# Patient Record
Sex: Female | Born: 1970 | Hispanic: Yes | Marital: Single | Smoking: Never smoker
Health system: Southern US, Community
[De-identification: ages and names within clinical notes are randomized; demographics above are authoritative.]

## PROBLEM LIST (undated history)

## (undated) DIAGNOSIS — E119 Type 2 diabetes mellitus without complications: Secondary | ICD-10-CM

---

## 2005-03-21 ENCOUNTER — Emergency Department: Payer: Self-pay | Admitting: Emergency Medicine

## 2006-04-15 ENCOUNTER — Other Ambulatory Visit: Payer: Self-pay

## 2006-04-15 ENCOUNTER — Emergency Department: Payer: Self-pay | Admitting: Emergency Medicine

## 2006-05-05 ENCOUNTER — Ambulatory Visit: Payer: Self-pay | Admitting: Family Medicine

## 2007-06-15 ENCOUNTER — Ambulatory Visit: Payer: Self-pay | Admitting: Obstetrics and Gynecology

## 2007-06-16 ENCOUNTER — Observation Stay: Payer: Self-pay | Admitting: Obstetrics and Gynecology

## 2007-06-27 ENCOUNTER — Inpatient Hospital Stay: Payer: Self-pay

## 2007-06-27 ENCOUNTER — Observation Stay: Payer: Self-pay

## 2008-06-17 ENCOUNTER — Observation Stay: Payer: Self-pay | Admitting: Emergency Medicine

## 2008-07-01 ENCOUNTER — Encounter: Payer: Self-pay | Admitting: Obstetrics & Gynecology

## 2008-11-22 ENCOUNTER — Ambulatory Visit: Payer: Self-pay | Admitting: Obstetrics and Gynecology

## 2008-11-25 ENCOUNTER — Inpatient Hospital Stay: Payer: Self-pay | Admitting: Obstetrics and Gynecology

## 2010-04-08 IMAGING — US US OB DETAIL+14 WK - NRPT [facility]
1 series · 14 of 28 positions shown · non-contrast
Comparison: none

[Series 1: us ob detail+14 wk - nrpt [facility] · 14 of 73 slices shown]
[im 3/73]
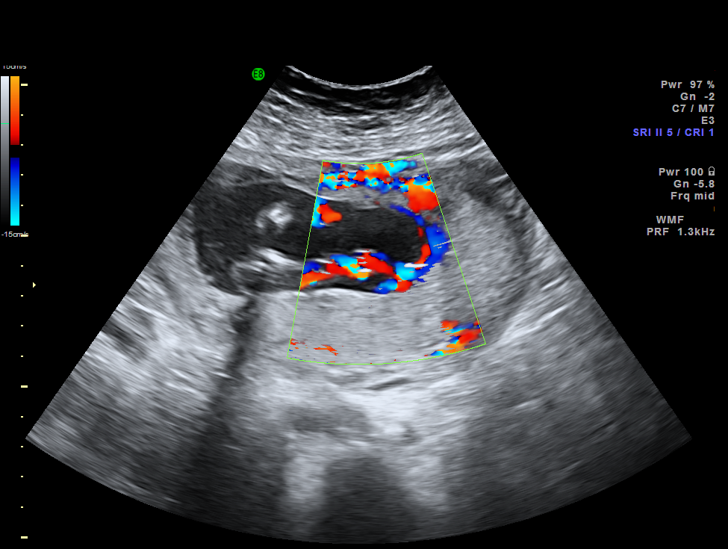
[im 9/73]
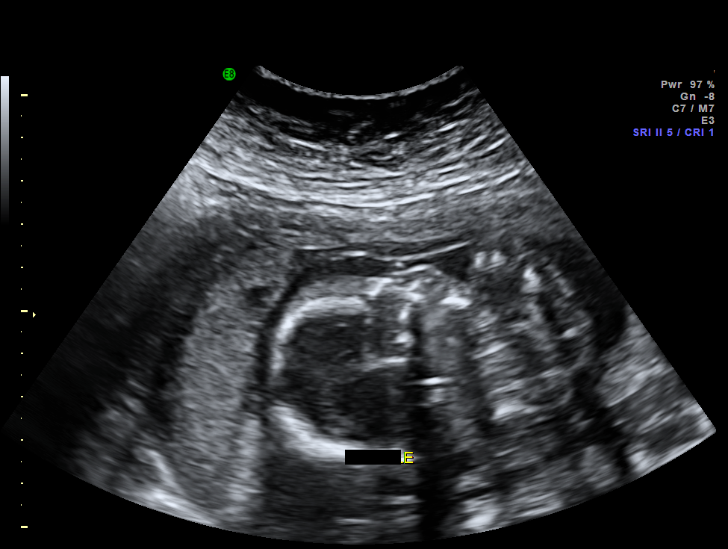
[im 14/73]
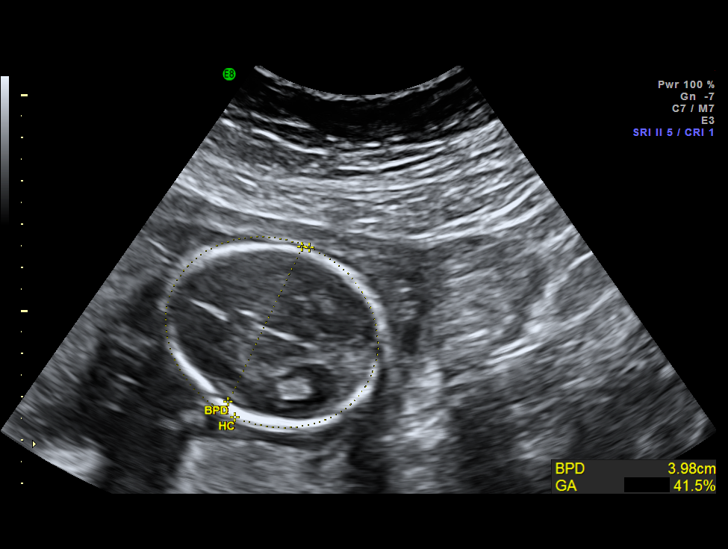
[im 19/73]
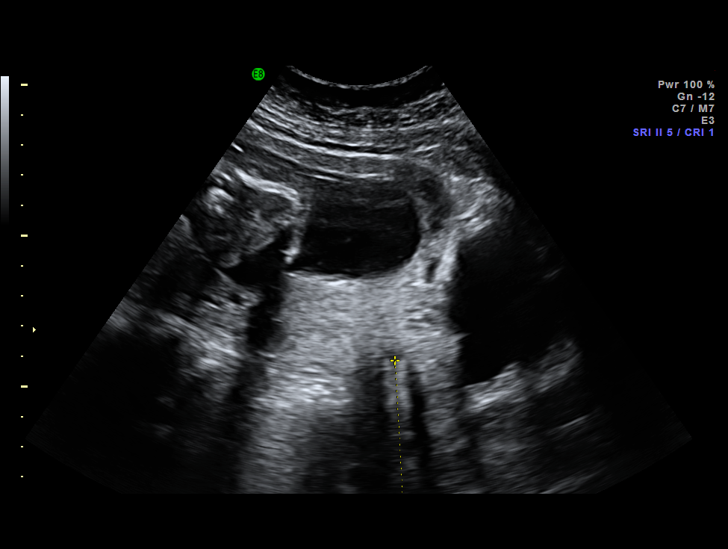
[im 25/73]
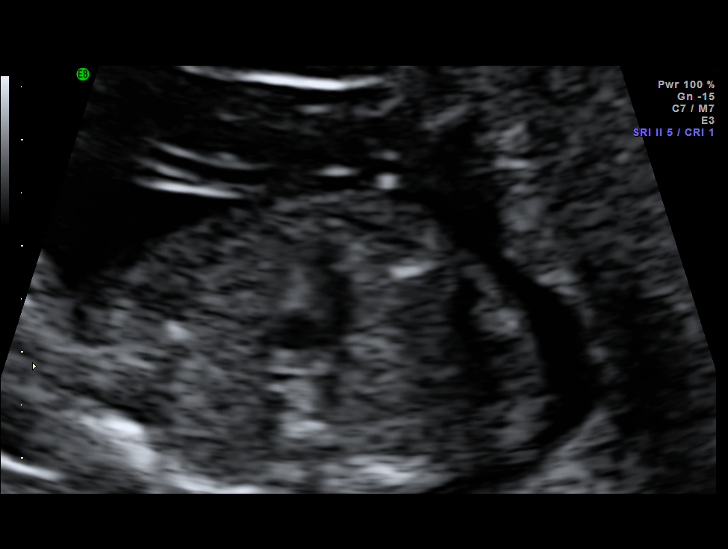
[im 30/73]
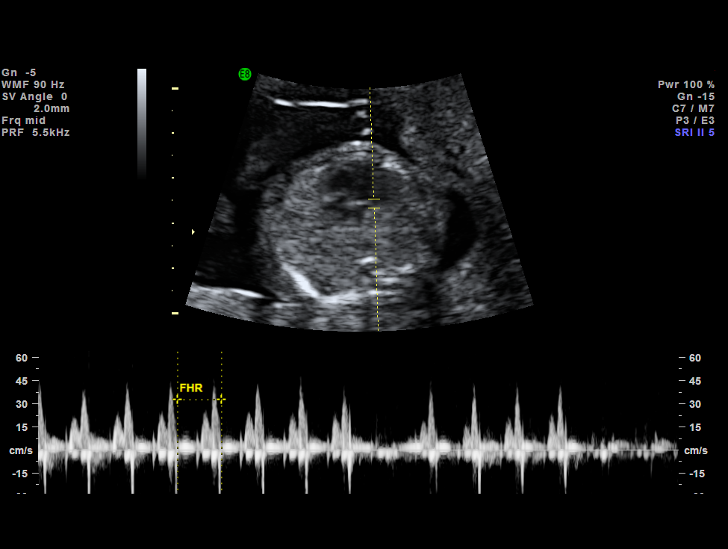
[im 35/73]
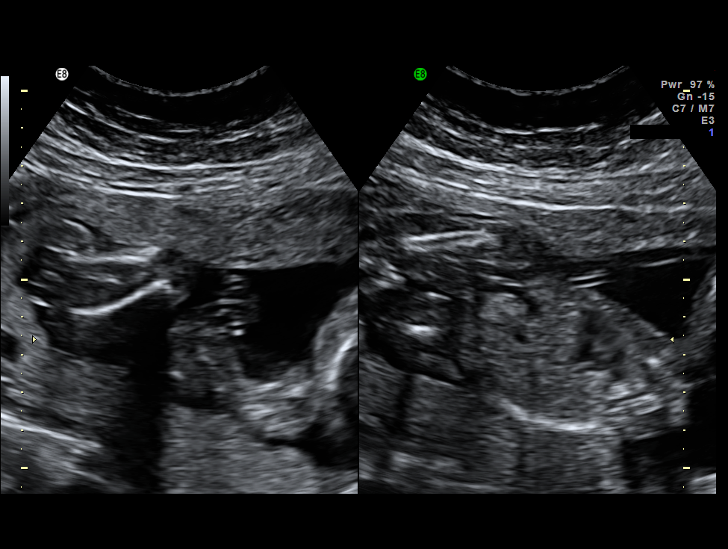
[im 41/73]
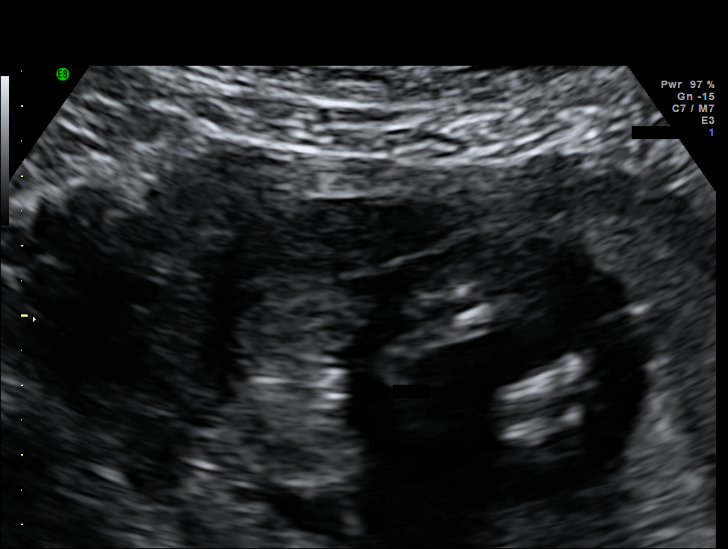
[im 46/73]
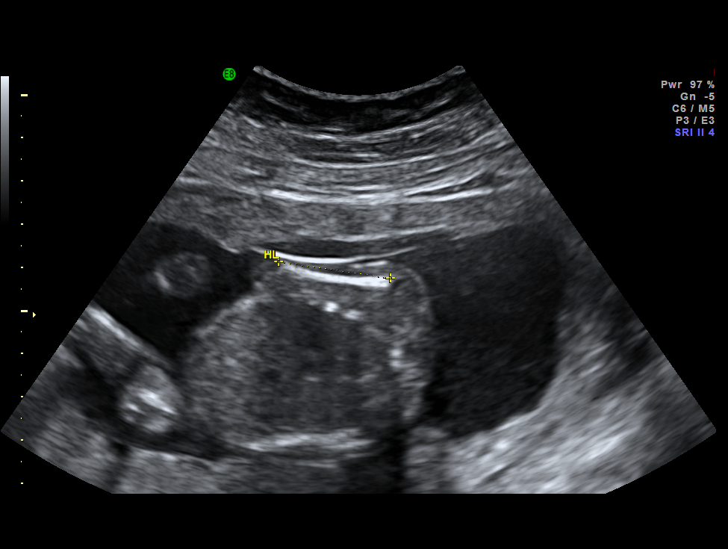
[im 51/73]
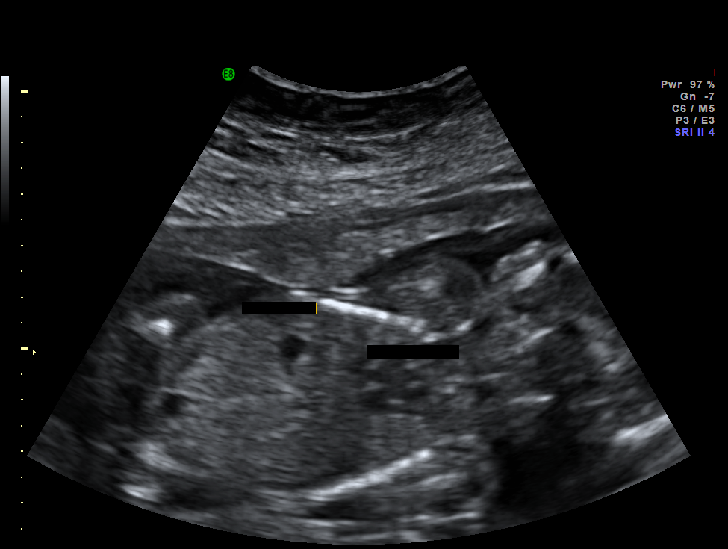
[im 57/73]
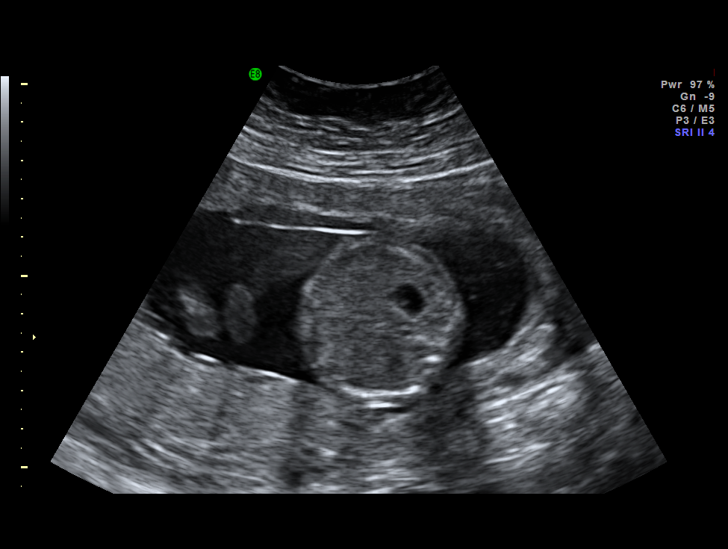
[im 62/73]
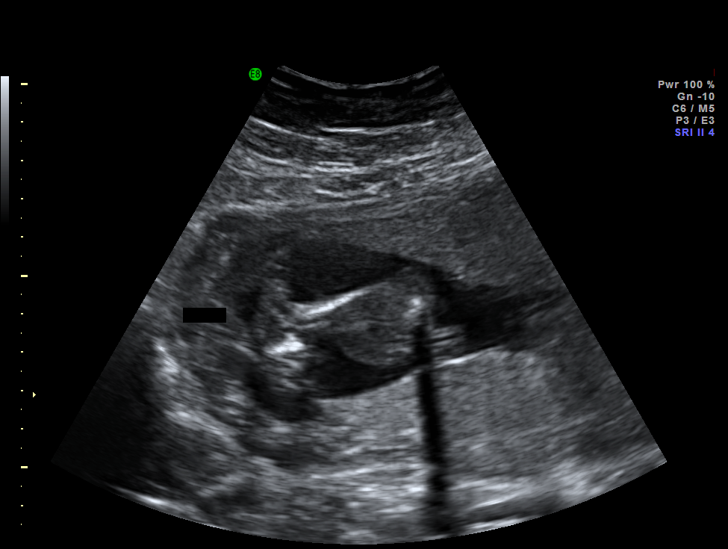
[im 67/73]
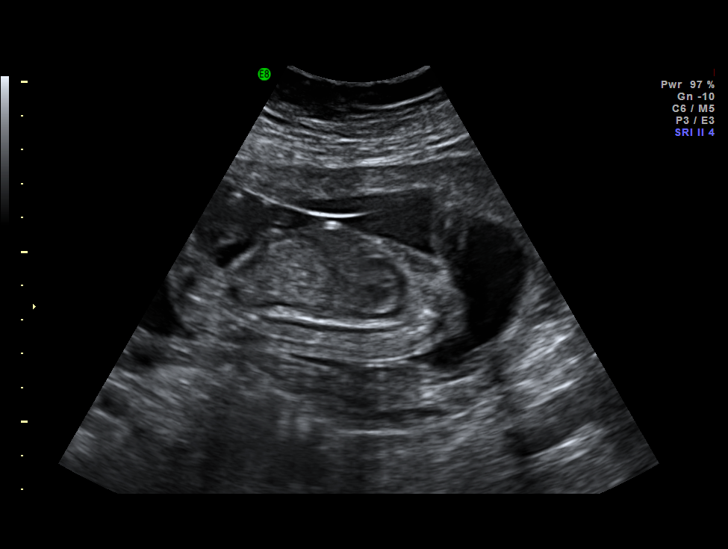
[im 73/73]
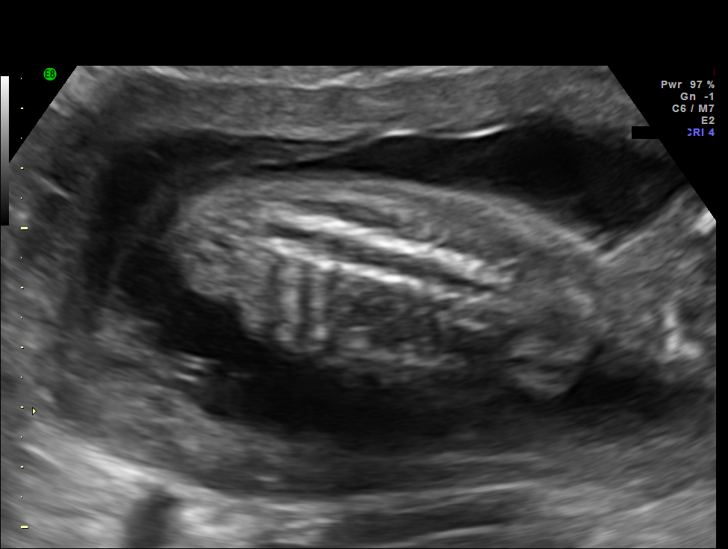

[14 of 28 positions shown; findings below may reference images not displayed]

IMAGES IMPORTED FROM THE SYNGO WORKFLOW SYSTEM
NO DICTATION FOR STUDY

## 2017-02-09 ENCOUNTER — Ambulatory Visit: Payer: Self-pay

## 2017-03-02 ENCOUNTER — Ambulatory Visit: Payer: Self-pay | Admitting: Internal Medicine

## 2017-03-02 ENCOUNTER — Encounter: Payer: Self-pay | Admitting: Internal Medicine

## 2017-03-02 VITALS — BP 119/80 | HR 72 | Temp 98.1°F | Wt 218.7 lb

## 2017-03-02 DIAGNOSIS — Z0000 Encounter for general adult medical examination without abnormal findings: Secondary | ICD-10-CM

## 2017-03-02 NOTE — Progress Notes (Signed)
   Subjective:    Patient ID: Cassandra Conley, female    DOB: 02/23/1970, 47 y.o.   MRN: [id number]  HPI  Pt has been experiencing vaginal itching for about 2 months. There is no discharge.  She has a burning sensation in her eyes and it is "grainy".   She also has pain in her left leg and her ankle is sometimes swollen.   Allergies as of 03/02/2017   No Known Allergies     Medication List    as of 03/02/2017 10:27 AM   You have not been prescribed any medications.    There are no active problems to display for this patient.   Review of Systems     Objective:   Physical Exam  Constitutional: She is oriented to person, place, and time.  Cardiovascular: Normal rate, regular rhythm and normal heart sounds.  Pulmonary/Chest: Effort normal and breath sounds normal.  Neurological: She is alert and oriented to person, place, and time.     BP 119/80   Pulse 72   Temp 98.1 F (36.7 C) (Oral)   Wt 218 lb 11.2 oz (99.2 kg)   Examination of left leg does not show significant edema. Pulses are shallow but present.     Assessment & Plan:    Referral for eye exam at the clinic. [facility] [place]   Labs ordered today: CMET, CBC, UA, TSH, lipid panel  No f/u appointment needed unless abnormal lab results.

## 2017-03-03 LAB — COMPREHENSIVE METABOLIC PANEL
A/G RATIO: 1.1 — AB (ref 1.2–2.2)
ALT: 17 IU/L (ref 0–32)
AST: 16 IU/L (ref 0–40)
Albumin: 4 g/dL (ref 3.5–5.5)
Alkaline Phosphatase: 108 IU/L (ref 39–117)
BUN / CREAT RATIO: 14 (ref 9–23)
BUN: 12 mg/dL (ref 6–24)
CHLORIDE: 103 mmol/L (ref 96–106)
CO2: 19 mmol/L — ABNORMAL LOW (ref 20–29)
Calcium: 9 mg/dL (ref 8.7–10.2)
Creatinine, Ser: 0.87 mg/dL (ref 0.57–1.00)
GFR calc non Af Amer: 80 mL/min/{1.73_m2} (ref >59)
GFR, EST AFRICAN AMERICAN: 92 mL/min/{1.73_m2} (ref >59)
GLOBULIN, TOTAL: 3.6 g/dL (ref 1.5–4.5)
Glucose: 194 mg/dL — ABNORMAL HIGH (ref 65–99)
POTASSIUM: 4.7 mmol/L (ref 3.5–5.2)
SODIUM: 137 mmol/L (ref 134–144)
TOTAL PROTEIN: 7.6 g/dL (ref 6.0–8.5)

## 2017-03-03 LAB — URINALYSIS
BILIRUBIN UA: NEGATIVE
KETONES UA: NEGATIVE
LEUKOCYTES UA: NEGATIVE
Nitrite, UA: NEGATIVE
PROTEIN UA: NEGATIVE
RBC UA: NEGATIVE
SPEC GRAV UA: 1.023 (ref 1.005–1.030)
Urobilinogen, Ur: 0.2 mg/dL (ref 0.2–1.0)
pH, UA: 5.5 (ref 5.0–7.5)

## 2017-03-03 LAB — LIPID PANEL
CHOL/HDL RATIO: 4 ratio (ref 0.0–4.4)
Cholesterol, Total: 169 mg/dL (ref 100–199)
HDL: 42 mg/dL (ref >39)
LDL Calculated: 90 mg/dL (ref 0–99)
Triglycerides: 183 mg/dL — ABNORMAL HIGH (ref 0–149)
VLDL Cholesterol Cal: 37 mg/dL (ref 5–40)

## 2017-03-03 LAB — CBC
Hematocrit: 36.1 % (ref 34.0–46.6)
Hemoglobin: 11.6 g/dL (ref 11.1–15.9)
MCH: 24.8 pg — AB (ref 26.6–33.0)
MCHC: 32.1 g/dL (ref 31.5–35.7)
MCV: 77 fL — ABNORMAL LOW (ref 79–97)
PLATELETS: 256 10*3/uL (ref 150–379)
RBC: 4.68 x10E6/uL (ref 3.77–5.28)
RDW: 15 % (ref 12.3–15.4)
WBC: 8.5 10*3/uL (ref 3.4–10.8)

## 2017-03-03 LAB — TSH: TSH: 3.14 u[IU]/mL (ref 0.450–4.500)

## 2017-03-21 ENCOUNTER — Ambulatory Visit: Payer: Self-pay | Admitting: Pharmacy Technician

## 2017-03-21 DIAGNOSIS — Z79899 Other long term (current) drug therapy: Secondary | ICD-10-CM

## 2017-03-21 NOTE — Progress Notes (Addendum)
Patient speaks Spanish.  Spanish Interpreter-Otto Afanador provided interpretation for the appointment.  Completed [facility] application and contract.  Patient agreed to all terms of the [facility] contract.    Patient approved to receive medication assistance at [facility] through 2019, as long as eligibility criteria continues to be met.   Provided patient with community resource material based on her particular needs.    [place] [facility]

## 2017-03-30 ENCOUNTER — Ambulatory Visit: Payer: Self-pay | Admitting: Ophthalmology

## 2017-04-13 ENCOUNTER — Ambulatory Visit: Payer: Self-pay | Admitting: Ophthalmology

## 2018-04-12 ENCOUNTER — Telehealth: Payer: Self-pay | Admitting: Pharmacy Technician

## 2018-04-12 NOTE — Telephone Encounter (Signed)
Received 2020 proof of income.  Patient eligible to receive medication assistance at [facility] as long as eligibility requirements continue to be met.  [place] [facility]

## 2019-03-31 ENCOUNTER — Telehealth: Payer: Self-pay | Admitting: Pharmacy Technician

## 2019-03-31 NOTE — Telephone Encounter (Signed)
Received updated proof of income.  Patient eligible to receive medication assistance at [facility] until time for re-certification in 9359, and as long as eligibility requirements continue to be met.  [place] [facility]

## 2019-04-01 ENCOUNTER — Ambulatory Visit: Payer: Self-pay | Attending: Internal Medicine

## 2019-04-01 DIAGNOSIS — Z23 Encounter for immunization: Secondary | ICD-10-CM

## 2019-04-01 NOTE — Progress Notes (Signed)
   Covid-19 Vaccination Clinic  Name:  Cassandra Conley    MRN: [id number] DOB: 1970/03/16  04/01/2019  Ms. Cassandra Conley was observed post Covid-19 immunization for 15 minutes without incident. She was provided with Vaccine Information Sheet and instruction to access the V-Safe system.   Ms. Cassandra Conley was instructed to call 911 with any severe reactions post vaccine: Marland Kitchen Difficulty breathing  . Swelling of face and throat  . A fast heartbeat  . A bad rash all over body  . Dizziness and weakness   Immunizations Administered    Name Date Dose VIS Date Route   Pfizer COVID-19 Vaccine 04/01/2019  2:38 PM 0.3 mL 12/22/2018 Intramuscular   Manufacturer: [employer], [employer]   Lot: [id number]   NDC: [address]-8

## 2019-04-22 ENCOUNTER — Ambulatory Visit: Payer: Self-pay | Attending: Internal Medicine

## 2019-04-22 DIAGNOSIS — Z23 Encounter for immunization: Secondary | ICD-10-CM

## 2019-04-22 NOTE — Progress Notes (Signed)
   Covid-19 Vaccination Clinic  Name:  Cassandra Conley    MRN: [id number] DOB: [birth year]  04/22/2019  Ms. Cassandra Conley was observed post Covid-19 immunization for 15 minutes without incident. She was provided with Vaccine Information Sheet and instruction to access the V-Safe system.   Ms. Cassandra Conley was instructed to call 911 with any severe reactions post vaccine: Marland Kitchen Difficulty breathing  . Swelling of face and throat  . A fast heartbeat  . A bad rash all over body  . Dizziness and weakness   Immunizations Administered    Name Date Dose VIS Date Route   Pfizer COVID-19 Vaccine 04/22/2019  2:37 PM 0.3 mL 12/22/2018 Intramuscular   Manufacturer: [employer], [employer]   Lot: [id number]   NDC: [address]-2

## 2019-08-17 ENCOUNTER — Other Ambulatory Visit: Payer: Self-pay | Admitting: Primary Care

## 2020-01-23 ENCOUNTER — Ambulatory Visit: Payer: Self-pay

## 2020-02-20 ENCOUNTER — Other Ambulatory Visit: Payer: Self-pay

## 2020-02-20 ENCOUNTER — Ambulatory Visit
Admission: RE | Admit: 2020-02-20 | Discharge: 2020-02-20 | Disposition: A | Discharge status: Home / Self Care | Payer: Self-pay | Source: Ambulatory Visit | Attending: Oncology | Admitting: Oncology

## 2020-02-20 ENCOUNTER — Ambulatory Visit: Payer: Self-pay | Attending: Oncology

## 2020-02-20 ENCOUNTER — Encounter: Payer: Self-pay | Admitting: Family Medicine

## 2020-02-20 VITALS — BP 118/84 | HR 74 | Temp 98.1°F | Ht 64.0 in | Wt 204.0 lb

## 2020-02-20 DIAGNOSIS — Z0000 Encounter for general adult medical examination without abnormal findings: Secondary | ICD-10-CM | POA: Insufficient documentation

## 2020-02-20 NOTE — Progress Notes (Signed)
  Subjective:     Patient ID: Cassandra Conley, female   DOB: 1970/04/30, 50 y.o.   MRN: [id number]  HPI   Review of Systems     Objective:   Physical Exam Chest:  Breasts:     Right: No swelling, bleeding, inverted nipple, mass, nipple discharge, skin change or tenderness.     Left: No swelling, bleeding, inverted nipple, mass, nipple discharge, skin change or tenderness.          Assessment:     50 year old Hispanic patient presents for [facility] clinic visit. Delos Haring interpreted exam.  Patient screened, and meets [facility] eligibility.  Patient does not have insurance, Medicare or Medicaid. Instructed patient on breast self awareness using teach back method.  Clinical breast exam unremarkable.  No mass or lump palpated.   Risk Assessment    Risk Scores      02/20/2020   Last edited by: Jim Like, RN   5-year risk: 0.7 %   Lifetime risk: 6.2 %            Plan:     Sent for bilateral screening mammogram.

## 2020-02-22 ENCOUNTER — Other Ambulatory Visit: Payer: Self-pay

## 2020-02-22 DIAGNOSIS — N63 Unspecified lump in unspecified breast: Secondary | ICD-10-CM

## 2020-02-22 NOTE — Progress Notes (Unsigned)
Additional views ordered

## 2020-03-06 ENCOUNTER — Ambulatory Visit
Admission: RE | Admit: 2020-03-06 | Discharge: 2020-03-06 | Disposition: A | Discharge status: Home / Self Care | Payer: Self-pay | Source: Ambulatory Visit | Attending: Oncology | Admitting: Oncology

## 2020-03-06 ENCOUNTER — Other Ambulatory Visit: Payer: Self-pay

## 2020-03-06 DIAGNOSIS — N63 Unspecified lump in unspecified breast: Secondary | ICD-10-CM | POA: Insufficient documentation

## 2020-04-15 ENCOUNTER — Other Ambulatory Visit: Payer: Self-pay

## 2020-04-15 MED ORDER — CYCLOBENZAPRINE HCL 10 MG PO TABS
ORAL_TABLET | ORAL | 3 refills | Status: DC
  Filled 2020-04-15: qty 30, 30d supply, fill #0
  Filled 2020-10-02: qty 30, 30d supply, fill #1

## 2020-04-15 MED ORDER — NAPROXEN 500 MG PO TABS
500.0000 mg | ORAL_TABLET | Freq: Two times a day (BID) | ORAL | 1 refills | Status: DC | PRN
  Filled 2020-04-15: qty 60, 30d supply, fill #0

## 2020-04-15 MED FILL — Metformin HCl Tab ER 24HR 500 MG: ORAL | 30 days supply | Qty: 30 | Fill #0 | Status: AC

## 2020-04-18 ENCOUNTER — Other Ambulatory Visit (HOSPITAL_COMMUNITY): Payer: Self-pay

## 2020-05-12 ENCOUNTER — Other Ambulatory Visit: Payer: Self-pay

## 2020-05-12 MED ORDER — FLUTICASONE PROPIONATE 50 MCG/ACT NA SUSP
NASAL | 2 refills | Status: DC
Start: 2020-05-12 — End: 2020-10-02
  Filled 2020-05-12 (×2): qty 16, 60d supply, fill #0
  Filled 2020-10-02: qty 16, 60d supply, fill #1

## 2020-05-12 MED ORDER — ALBUTEROL SULFATE HFA 108 (90 BASE) MCG/ACT IN AERS
INHALATION_SPRAY | RESPIRATORY_TRACT | 0 refills | Status: DC
  Filled 2020-05-12: qty 18, 30d supply, fill #0
  Filled 2020-05-12: qty 18, 17d supply, fill #0

## 2020-05-12 MED ORDER — MUCINEX MAXIMUM STRENGTH 1200 MG PO TB12: ORAL_TABLET | ORAL | 0 refills | Status: DC

## 2020-05-14 ENCOUNTER — Telehealth: Payer: Self-pay | Admitting: Pharmacist

## 2020-05-14 NOTE — Telephone Encounter (Signed)
PATIENT [facility] ELIGIBLE TILL 04/11/21.Cassandra Conley

## 2020-05-26 ENCOUNTER — Other Ambulatory Visit: Payer: Self-pay

## 2020-05-26 MED FILL — Metformin HCl Tab ER 24HR 500 MG: ORAL | 30 days supply | Qty: 30 | Fill #1 | Status: AC

## 2020-05-27 ENCOUNTER — Other Ambulatory Visit: Payer: Self-pay

## 2020-06-26 ENCOUNTER — Other Ambulatory Visit: Payer: Self-pay

## 2020-06-26 MED FILL — Metformin HCl Tab ER 24HR 500 MG: ORAL | 30 days supply | Qty: 30 | Fill #2 | Status: AC

## 2020-07-01 ENCOUNTER — Other Ambulatory Visit: Payer: Self-pay

## 2020-10-02 ENCOUNTER — Other Ambulatory Visit: Payer: Self-pay

## 2020-10-02 MED ORDER — FLUTICASONE PROPIONATE 50 MCG/ACT NA SUSP
NASAL | 5 refills | Status: AC
  Filled 2020-10-02: qty 16, 60d supply, fill #0

## 2020-10-02 MED ORDER — METFORMIN HCL ER 500 MG PO TB24
ORAL_TABLET | ORAL | 1 refills | Status: DC
  Filled 2020-10-02: qty 90, 90d supply, fill #0

## 2020-10-02 MED ORDER — ALBUTEROL SULFATE HFA 108 (90 BASE) MCG/ACT IN AERS
INHALATION_SPRAY | RESPIRATORY_TRACT | 5 refills | Status: DC
  Filled 2020-10-02: qty 8.5, 17d supply, fill #0
  Filled 2020-11-18: qty 25.5, 51d supply, fill #1
  Filled 2021-01-23: qty 17, 33d supply, fill #2

## 2020-11-18 ENCOUNTER — Other Ambulatory Visit: Payer: Self-pay

## 2020-11-19 ENCOUNTER — Other Ambulatory Visit: Payer: Self-pay

## 2020-12-01 ENCOUNTER — Other Ambulatory Visit: Payer: Self-pay

## 2020-12-01 ENCOUNTER — Emergency Department
Admission: EM | Admit: 2020-12-01 | Discharge: 2020-12-01 | Disposition: A | Discharge status: Home / Self Care | Payer: Self-pay | Attending: Emergency Medicine | Admitting: Emergency Medicine

## 2020-12-01 ENCOUNTER — Emergency Department: Payer: Self-pay

## 2020-12-01 DIAGNOSIS — X500XXA Overexertion from strenuous movement or load, initial encounter: Secondary | ICD-10-CM | POA: Insufficient documentation

## 2020-12-01 DIAGNOSIS — M7552 Bursitis of left shoulder: Secondary | ICD-10-CM | POA: Insufficient documentation

## 2020-12-01 DIAGNOSIS — Z7984 Long term (current) use of oral hypoglycemic drugs: Secondary | ICD-10-CM | POA: Insufficient documentation

## 2020-12-01 DIAGNOSIS — S46912A Strain of unspecified muscle, fascia and tendon at shoulder and upper arm level, left arm, initial encounter: Secondary | ICD-10-CM | POA: Insufficient documentation

## 2020-12-01 DIAGNOSIS — E119 Type 2 diabetes mellitus without complications: Secondary | ICD-10-CM | POA: Insufficient documentation

## 2020-12-01 DIAGNOSIS — Y990 Civilian activity done for income or pay: Secondary | ICD-10-CM | POA: Insufficient documentation

## 2020-12-01 DIAGNOSIS — T148XXA Other injury of unspecified body region, initial encounter: Secondary | ICD-10-CM

## 2020-12-01 HISTORY — DX: Type 2 diabetes mellitus without complications: E11.9

## 2020-12-01 MED ORDER — MELOXICAM 15 MG PO TABS
15.0000 mg | ORAL_TABLET | Freq: Every day | ORAL | 2 refills | Status: AC
  Filled 2020-12-01: qty 30, 30d supply, fill #0

## 2020-12-01 MED ORDER — BACLOFEN 10 MG PO TABS
10.0000 mg | ORAL_TABLET | Freq: Three times a day (TID) | ORAL | 0 refills | Status: AC
  Filled 2020-12-01: qty 21, 7d supply, fill #0

## 2020-12-01 NOTE — ED Notes (Signed)
Sling applied by Faith, NT. Pt tolerated well. Pt ambulated to lobby with son.

## 2020-12-01 NOTE — ED Notes (Addendum)
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 Called employer that states pt only needs a urine test for [employer].  [phone]

## 2020-12-01 NOTE — ED Triage Notes (Signed)
Interpreter used Pt reports pain to left shoulder several days, unable to raise it. Reports pain since using hammer at work several days ago  Pt reports unsure if workers comp, informed pt to attempt to contact boss to determine

## 2020-12-01 NOTE — ED Provider Notes (Signed)
[facility] Emergency Department Provider Note  ____________________________________________   Event Date/Time   First MD Initiated Contact with Patient 12/01/20 1108     (approximate)  I have reviewed the triage vital signs and the nursing notes.   HISTORY  Chief Complaint Arm Pain    HPI Cassandra Conley is a 50 y.o. female presents emergency department complaining of left shoulder pain.  Patient started a new position at her job.  States at first she felt was sore as she was getting used to the job.  Patient states now she cannot lift the left arm over her head.  Has pain at night when she is sleeping.  No numbness or tingling.  Symptoms for 2 weeks  Past Medical History:  Diagnosis Date   Diabetes mellitus without complication (HCC)     There are no problems to display for this patient.   History reviewed. No pertinent surgical history.  Prior to Admission medications   Medication Sig Start Date End Date Taking? Authorizing Provider  baclofen (LIORESAL) 10 MG tablet Take 1 tablet (10 mg total) by mouth 3 (three) times daily for 7 days. 12/01/20 12/08/20 Yes Zaniel Marineau, Roselyn Bering, PA-C  meloxicam (MOBIC) 15 MG tablet Take 1 tablet (15 mg total) by mouth daily. 12/01/20 12/01/21 Yes Maylie Ashton, Roselyn Bering, PA-C  albuterol (PROAIR HFA) 108 (90 Base) MCG/ACT inhaler INHALE 2 PUFFS BY MOUTH ONCE EVERY 4 HOURS AS DIRECTED FOR COUGH. 10/02/20     fluticasone (FLONASE) 50 MCG/ACT nasal spray Spray 1 spray into both nostrils once a day for allergies 10/02/20     Guaifenesin (MUCINEX MAXIMUM STRENGTH) 1200 MG TB12 Take 1 tablet by mouth every twelve hours for cough and chest congestion 05/12/20     metFORMIN (GLUCOPHAGE-XR) 500 MG 24 hr tablet TAKE ONE TABLET BY MOUTH ONCE DAILY. 10/02/20       Allergies Patient has no known allergies.  Family History  Problem Relation Age of Onset   Breast cancer Neg Hx     Social History Social History   Tobacco Use    Smoking status: Never    Review of Systems  Constitutional: No fever/chills Eyes: No visual changes. ENT: No sore throat. Respiratory: Denies cough Cardiovascular: Denies chest pain Gastrointestinal: Denies abdominal pain Genitourinary: Negative for dysuria. Musculoskeletal: Negative for back pain.  Positive for left shoulder pain Skin: Negative for rash. Psychiatric: no mood changes,     ____________________________________________   PHYSICAL EXAM:  VITAL SIGNS: ED Triage Vitals  Enc Vitals Group     BP 12/01/20 0905 (!) 150/89     Pulse Rate 12/01/20 0905 61     Resp 12/01/20 0905 20     Temp 12/01/20 0905 (!) 97.5 F (36.4 C)     Temp Source 12/01/20 0905 Oral     SpO2 12/01/20 0905 100 %     Weight 12/01/20 0912 200 lb (90.7 kg)     Height 12/01/20 0912 5\' 7"  (1.702 m)     Head Circumference --      Peak Flow --      Pain Score 12/01/20 0911 9     Pain Loc --      Pain Edu? --      Excl. in GC? --     Constitutional: Alert and oriented. Well appearing and in no acute distress. Eyes: Conjunctivae are normal.  Head: Atraumatic. Nose: No congestion/rhinnorhea. Mouth/Throat: Mucous membranes are moist.   Neck:  supple no lymphadenopathy noted Cardiovascular: Normal  rate, regular rhythm.  Respiratory: Normal respiratory effort.  No retractions,  GU: deferred Musculoskeletal: Decreased range of motion of the left shoulder with overhead reach, pain is reproduced with internal rotation, positive Hawkins sign Neurologic:  Normal speech and language.  Skin:  Skin is warm, dry and intact. No rash noted. Psychiatric: Mood and affect are normal. Speech and behavior are normal.  ____________________________________________   LABS (all labs ordered are listed, but only abnormal results are displayed)  Labs Reviewed - No data to display ____________________________________________   ____________________________________________  RADIOLOGY  X-ray of the left  shoulder  ____________________________________________   PROCEDURES  Procedure(s) performed: Sling applied by nursing staff   Procedures    ____________________________________________   INITIAL IMPRESSION / ASSESSMENT AND PLAN / ED COURSE  Pertinent labs & imaging results that were available during my care of the patient were reviewed by me and considered in my medical decision making (see chart for details).   The patient is a 50 year old female presents emergency department with left shoulder pain.  See HPI.  Physical exam shows patient to be stable.  She does have decreased range of motion of the left shoulder which is more of an impingement.  Concerns for bursitis and rotator cuff injury.  She was placed in a sling.  X-ray of the left shoulder was reviewed by me and confirmed by radiology be negative for any acute abnormalities  I did discuss these findings with the patient via the [employer] interpreter.  She is to take meloxicam and baclofen daily.  Apply ice to the shoulder.  I gave her a work note for her to return on Monday.  She states they are only working for 2 days this week.  Explained her she should keep the left arm in the sling for most of the day, I did demonstrate exercises for her to do.  She was given written instructions to return if worsening.  Discharged in stable condition in the care of her son.     Cassandra Conley was evaluated in Emergency Department on 12/01/2020 for the symptoms described in the history of present illness. She was evaluated in the context of the global COVID-19 pandemic, which necessitated consideration that the patient might be at risk for infection with the SARS-CoV-2 virus that causes COVID-19. Institutional protocols and algorithms that pertain to the evaluation of patients at risk for COVID-19 are in a state of rapid change based on information released by regulatory bodies including the CDC and federal and state organizations. These  policies and algorithms were followed during the patient's care in the ED.    As part of my medical decision making, I reviewed the following data within the electronic MEDICAL RECORD NUMBER Nursing notes reviewed and incorporated, Old chart reviewed, Radiograph reviewed , Notes from prior ED visits, and [place] Controlled Substance Database  ____________________________________________   FINAL CLINICAL IMPRESSION(S) / ED DIAGNOSES  Final diagnoses:  Acute bursitis of left shoulder  Muscle strain      NEW MEDICATIONS STARTED DURING THIS VISIT:  New Prescriptions   BACLOFEN (LIORESAL) 10 MG TABLET    Take 1 tablet (10 mg total) by mouth 3 (three) times daily for 7 days.   MELOXICAM (MOBIC) 15 MG TABLET    Take 1 tablet (15 mg total) by mouth daily.     Note:  This document was prepared using Dragon voice recognition software and may include unintentional dictation errors.    Faythe Ghee, PA-C 12/01/20 1155  Jene Every, MD 12/01/20 [phone]

## 2021-01-23 ENCOUNTER — Other Ambulatory Visit: Payer: Self-pay

## 2021-01-26 ENCOUNTER — Other Ambulatory Visit: Payer: Self-pay

## 2021-01-27 ENCOUNTER — Other Ambulatory Visit: Payer: Self-pay

## 2021-01-27 MED ORDER — ALBUTEROL SULFATE HFA 108 (90 BASE) MCG/ACT IN AERS
INHALATION_SPRAY | RESPIRATORY_TRACT | 1 refills | Status: DC
  Filled 2021-01-27: qty 17, 34d supply, fill #0

## 2021-01-28 ENCOUNTER — Other Ambulatory Visit: Payer: Self-pay

## 2021-02-05 ENCOUNTER — Other Ambulatory Visit: Payer: Self-pay

## 2021-02-05 MED ORDER — ALBUTEROL SULFATE HFA 108 (90 BASE) MCG/ACT IN AERS
INHALATION_SPRAY | RESPIRATORY_TRACT | 1 refills | Status: DC
  Filled 2021-02-05: qty 8.5, fill #0
  Filled 2021-03-23: qty 8.5, 17d supply, fill #0

## 2021-03-17 ENCOUNTER — Other Ambulatory Visit: Payer: Self-pay

## 2021-03-17 MED ORDER — METFORMIN HCL ER 500 MG PO TB24
ORAL_TABLET | Freq: Every day | ORAL | 0 refills | Status: DC
Start: 2021-03-17 — End: 2021-04-01
  Filled 2021-03-17: qty 30, 30d supply, fill #0

## 2021-03-23 ENCOUNTER — Other Ambulatory Visit: Payer: Self-pay

## 2021-03-25 ENCOUNTER — Telehealth: Payer: Self-pay | Admitting: Pharmacist

## 2021-03-25 NOTE — Telephone Encounter (Signed)
03/25/2021 11:02:49 AM - ProAir refill faxed to [facility] ?-- Tarry Kos - Wednesday, March 25, 2021 10:59 AM -- ?Faxed refill request for ProAir to [facility]  ?

## 2021-04-01 ENCOUNTER — Other Ambulatory Visit: Payer: Self-pay

## 2021-04-01 MED ORDER — METFORMIN HCL ER 500 MG PO TB24: ORAL_TABLET | Freq: Every day | ORAL | 1 refills | Status: DC

## 2021-04-03 ENCOUNTER — Other Ambulatory Visit: Payer: Self-pay

## 2021-04-03 MED ORDER — AZITHROMYCIN 500 MG PO TABS
ORAL_TABLET | ORAL | 0 refills | Status: DC
  Filled 2021-04-03: qty 2, 1d supply, fill #0

## 2021-04-03 MED ORDER — BASAGLAR KWIKPEN 100 UNIT/ML ~~LOC~~ SOPN
PEN_INJECTOR | SUBCUTANEOUS | 0 refills | Status: AC
  Filled 2021-04-03: qty 15, 140d supply, fill #0

## 2021-04-03 MED ORDER — BASAGLAR KWIKPEN 100 UNIT/ML ~~LOC~~ SOPN
PEN_INJECTOR | SUBCUTANEOUS | 1 refills | Status: DC
  Filled 2021-04-03: qty 15, 140d supply, fill #0

## 2021-04-03 MED ORDER — COMFORT EZ PEN NEEDLES 32G X 4 MM MISC
3 refills | Status: AC
  Filled 2021-04-03: qty 100, 100d supply, fill #0

## 2021-04-06 ENCOUNTER — Other Ambulatory Visit: Payer: Self-pay

## 2021-04-06 MED ORDER — ALBUTEROL SULFATE HFA 108 (90 BASE) MCG/ACT IN AERS
INHALATION_SPRAY | RESPIRATORY_TRACT | 5 refills | Status: AC
  Filled 2021-04-06: qty 8.5, fill #0
  Filled 2021-04-14: qty 25.5, 90d supply, fill #0
  Filled 2021-04-30: qty 25.5, 50d supply, fill #0

## 2021-04-07 ENCOUNTER — Other Ambulatory Visit: Payer: Self-pay

## 2021-04-07 MED ORDER — RIGHTEST GM550 BLOOD GLUCOSE W/DEVICE KIT
PACK | 30 refills | Status: AC
  Filled 2021-04-07: qty 1, 30d supply, fill #0

## 2021-04-07 MED ORDER — RIGHTEST GL300 LANCETS MISC
3 refills | Status: AC
Start: 2021-04-07 — End: ?
  Filled 2021-04-07 – 2021-06-15 (×2): qty 100, 25d supply, fill #0

## 2021-04-07 MED ORDER — METFORMIN HCL ER 500 MG PO TB24
1000.0000 mg | ORAL_TABLET | Freq: Two times a day (BID) | ORAL | 1 refills | Status: DC
  Filled 2021-04-07: qty 120, 30d supply, fill #0

## 2021-04-07 MED ORDER — RIGHTEST GL300 LANCETS MISC
0 refills | Status: DC
  Filled 2021-04-07: qty 100, 25d supply, fill #0

## 2021-04-07 MED ORDER — RIGHTEST GS550 BLOOD GLUCOSE VI STRP
ORAL_STRIP | 0 refills | Status: DC
  Filled 2021-04-07: qty 100, 25d supply, fill #0

## 2021-04-07 MED ORDER — RIGHTEST GS550 BLOOD GLUCOSE VI STRP
ORAL_STRIP | 11 refills | Status: AC
  Filled 2021-04-07 – 2021-06-15 (×2): qty 100, 25d supply, fill #0

## 2021-04-08 ENCOUNTER — Other Ambulatory Visit: Payer: Self-pay

## 2021-04-10 ENCOUNTER — Other Ambulatory Visit: Payer: Self-pay

## 2021-04-14 ENCOUNTER — Other Ambulatory Visit: Payer: Self-pay

## 2021-04-27 ENCOUNTER — Telehealth: Payer: Self-pay | Admitting: Licensed Clinical Social Worker

## 2021-04-27 NOTE — Telephone Encounter (Signed)
Patient left vm on 04/24/21 requesting a return call.  ?

## 2021-04-29 ENCOUNTER — Other Ambulatory Visit: Payer: Self-pay

## 2021-04-30 ENCOUNTER — Other Ambulatory Visit: Payer: Self-pay

## 2021-05-12 ENCOUNTER — Other Ambulatory Visit: Payer: Self-pay

## 2021-05-12 MED ORDER — METFORMIN HCL ER 500 MG PO TB24
1000.0000 mg | ORAL_TABLET | Freq: Two times a day (BID) | ORAL | 1 refills | Status: AC
  Filled 2021-05-12 – 2021-06-15 (×2): qty 120, 30d supply, fill #0
  Filled 2021-08-18: qty 120, 30d supply, fill #1
  Filled 2021-10-01: qty 120, 30d supply, fill #2

## 2021-05-13 ENCOUNTER — Other Ambulatory Visit: Payer: Self-pay

## 2021-05-15 ENCOUNTER — Other Ambulatory Visit: Payer: Self-pay

## 2021-06-15 ENCOUNTER — Other Ambulatory Visit: Payer: Self-pay

## 2021-06-24 ENCOUNTER — Other Ambulatory Visit: Payer: Self-pay

## 2021-07-15 ENCOUNTER — Other Ambulatory Visit: Payer: Self-pay

## 2021-07-17 ENCOUNTER — Other Ambulatory Visit: Payer: Self-pay

## 2021-08-03 ENCOUNTER — Other Ambulatory Visit: Payer: Self-pay

## 2021-08-18 ENCOUNTER — Other Ambulatory Visit: Payer: Self-pay

## 2021-10-01 ENCOUNTER — Other Ambulatory Visit: Payer: Self-pay

## 2021-10-07 ENCOUNTER — Other Ambulatory Visit: Payer: Self-pay

## 2021-10-08 ENCOUNTER — Other Ambulatory Visit: Payer: Self-pay

## 2021-10-09 ENCOUNTER — Other Ambulatory Visit: Payer: Self-pay

## 2021-10-12 ENCOUNTER — Other Ambulatory Visit: Payer: Self-pay

## 2021-10-13 ENCOUNTER — Other Ambulatory Visit: Payer: Self-pay

## 2021-10-13 MED ORDER — EMPAGLIFLOZIN 10 MG PO TABS: ORAL_TABLET | ORAL | 1 refills | Status: AC

## 2021-10-14 ENCOUNTER — Other Ambulatory Visit: Payer: Self-pay

## 2021-11-06 ENCOUNTER — Other Ambulatory Visit: Payer: Self-pay

## 2021-11-06 DIAGNOSIS — Z1231 Encounter for screening mammogram for malignant neoplasm of breast: Secondary | ICD-10-CM

## 2021-11-10 ENCOUNTER — Telehealth: Payer: Self-pay | Admitting: *Deleted

## 2021-11-10 ENCOUNTER — Ambulatory Visit: Payer: Self-pay | Attending: Hematology and Oncology

## 2021-11-23 ENCOUNTER — Other Ambulatory Visit: Payer: Self-pay

## 2022-04-22 ENCOUNTER — Other Ambulatory Visit: Payer: Self-pay

## 2022-04-22 ENCOUNTER — Emergency Department
Admission: EM | Admit: 2022-04-22 | Discharge: 2022-04-22 | Disposition: A | Discharge status: Home / Self Care | Payer: Medicaid Other | Attending: Emergency Medicine | Admitting: Emergency Medicine

## 2022-04-22 DIAGNOSIS — R519 Headache, unspecified: Secondary | ICD-10-CM | POA: Diagnosis present

## 2022-04-22 DIAGNOSIS — B349 Viral infection, unspecified: Secondary | ICD-10-CM | POA: Insufficient documentation

## 2022-04-22 DIAGNOSIS — Z20822 Contact with and (suspected) exposure to covid-19: Secondary | ICD-10-CM | POA: Diagnosis not present

## 2022-04-22 DIAGNOSIS — E119 Type 2 diabetes mellitus without complications: Secondary | ICD-10-CM | POA: Insufficient documentation

## 2022-04-22 LAB — SARS CORONAVIRUS 2 BY RT PCR: SARS Coronavirus 2 by RT PCR: NEGATIVE

## 2022-04-22 MED ORDER — ACETAMINOPHEN-CODEINE 300-30 MG PO TABS
1.0000 | ORAL_TABLET | Freq: Four times a day (QID) | ORAL | 0 refills | Status: AC | PRN
  Filled 2022-04-22: qty 16, 2d supply, fill #0

## 2022-04-22 MED ORDER — KETOROLAC TROMETHAMINE 60 MG/2ML IM SOLN
30.0000 mg | Freq: Once | INTRAMUSCULAR | Status: AC
  Administered 2022-04-22: 30 mg via INTRAMUSCULAR
  Filled 2022-04-22: qty 2

## 2022-04-22 NOTE — ED Triage Notes (Signed)
Pt to ED for headache, congestion, ear pain started yesterday. Denies fevers.

## 2022-04-22 NOTE — ED Provider Notes (Signed)
   [facility] Provider Note    Event Date/Time   First MD Initiated Contact with Patient 04/22/22 [phone]     (approximate)   History   Flu symptoms   HPI  Cassandra Conley is a 52 y.o. female  with history of diabetes and as listed in EMR presents to the emergency department for evaluation of headache, sinus congestion, earache since yesterday.  No known fever. No nausea, vomiting, or diarrhea. No known exposure. No alleviating measures attempted prior to arrival..      Physical Exam   Triage Vital Signs: ED Triage Vitals  Enc Vitals Group     BP 04/22/22 0808 (!) 153/100     Pulse Rate 04/22/22 0808 94     Resp 04/22/22 0808 16     Temp 04/22/22 0810 98.4 F (36.9 C)     Temp src --      SpO2 04/22/22 0808 95 %     Weight 04/22/22 0810 180 lb (81.6 kg)     Height --      Head Circumference --      Peak Flow --      Pain Score 04/22/22 0807 10     Pain Loc --      Pain Edu? --      Excl. in GC? --     Most recent vital signs: Vitals:   04/22/22 0808 04/22/22 0810  BP: (!) 153/100   Pulse: 94   Resp: 16   Temp:  98.4 F (36.9 C)  SpO2: 95%     General: Awake, no distress.  CV:  Good peripheral perfusion.  Resp:  Normal effort. Breath sounds are clear. Abd:  No distention.  Other:  TM mildly erythematous and injected. Posterior oropharynx erythematous without exudate.   ED Results / Procedures / Treatments   Labs (all labs ordered are listed, but only abnormal results are displayed) Labs Reviewed  SARS CORONAVIRUS 2 BY RT PCR     EKG  Not indicated.  RADIOLOGY   PROCEDURES:  Critical Care performed: No  Procedures   MEDICATIONS ORDERED IN ED:  Medications  ketorolac (TORADOL) injection 30 mg (30 mg Intramuscular Given 04/22/22 0841)     IMPRESSION / MDM / ASSESSMENT AND PLAN / ED COURSE   I have reviewed the triage note.  Differential diagnosis includes, but is not limited to, Covid, influenza, URI,  seasonal allergies.  Patient's presentation is most consistent with acute illness / injury with system symptoms.  52 year old female presenting to the emergency department for treatment and evaluation of viral symptoms as described in the HPI.   Covid test is negative. Plan will be to discharge her home with prescription forTylenol #3 and recommendations for supportive care.      FINAL CLINICAL IMPRESSION(S) / ED DIAGNOSES   Final diagnoses:  Acute viral syndrome     Rx / DC Orders   ED Discharge Orders          Ordered    acetaminophen-codeine (TYLENOL #3) 300-30 MG tablet  Every 6 hours PRN        04/22/22 0958             Note:  This document was prepared using Dragon voice recognition software and may include unintentional dictation errors.   Chinita Pester, FNP 04/22/22 3833    Pilar Jarvis, MD 04/22/22 [phone]

## 2022-06-01 ENCOUNTER — Other Ambulatory Visit: Payer: Self-pay | Admitting: Family Medicine

## 2022-06-01 DIAGNOSIS — Z1231 Encounter for screening mammogram for malignant neoplasm of breast: Secondary | ICD-10-CM

## 2022-09-21 ENCOUNTER — Other Ambulatory Visit: Payer: Self-pay

## 2023-01-26 ENCOUNTER — Emergency Department
Admission: EM | Admit: 2023-01-26 | Discharge: 2023-01-26 | Disposition: A | Discharge status: Home / Self Care | Payer: Medicaid Other | Attending: Emergency Medicine | Admitting: Emergency Medicine

## 2023-01-26 ENCOUNTER — Other Ambulatory Visit: Payer: Self-pay

## 2023-01-26 ENCOUNTER — Encounter: Payer: Self-pay | Admitting: Emergency Medicine

## 2023-01-26 DIAGNOSIS — B349 Viral infection, unspecified: Secondary | ICD-10-CM | POA: Diagnosis not present

## 2023-01-26 DIAGNOSIS — E119 Type 2 diabetes mellitus without complications: Secondary | ICD-10-CM | POA: Diagnosis not present

## 2023-01-26 DIAGNOSIS — R1084 Generalized abdominal pain: Secondary | ICD-10-CM | POA: Insufficient documentation

## 2023-01-26 LAB — CBC
HCT: 41 % (ref 36.0–46.0)
Hemoglobin: 13.5 g/dL (ref 12.0–15.0)
MCH: 26.9 pg (ref 26.0–34.0)
MCHC: 32.9 g/dL (ref 30.0–36.0)
MCV: 81.7 fL (ref 80.0–100.0)
Platelets: 230 10*3/uL (ref 150–400)
RBC: 5.02 MIL/uL (ref 3.87–5.11)
RDW: 13 % (ref 11.5–15.5)
WBC: 8.2 10*3/uL (ref 4.0–10.5)
nRBC: 0 % (ref 0.0–0.2)

## 2023-01-26 LAB — COMPREHENSIVE METABOLIC PANEL
ALT: 18 U/L (ref 0–44)
AST: 17 U/L (ref 15–41)
Albumin: 3.7 g/dL (ref 3.5–5.0)
Alkaline Phosphatase: 128 U/L — ABNORMAL HIGH (ref 38–126)
Anion gap: 8 (ref 5–15)
BUN: 16 mg/dL (ref 6–20)
CO2: 27 mmol/L (ref 22–32)
Calcium: 9.2 mg/dL (ref 8.9–10.3)
Chloride: 98 mmol/L (ref 98–111)
Creatinine, Ser: 0.73 mg/dL (ref 0.44–1.00)
GFR, Estimated: >60 mL/min (ref >60)
Glucose, Bld: 288 mg/dL — ABNORMAL HIGH (ref 70–99)
Potassium: 4.3 mmol/L (ref 3.5–5.1)
Sodium: 133 mmol/L — ABNORMAL LOW (ref 135–145)
Total Bilirubin: 0.5 mg/dL (ref 0.0–1.2)
Total Protein: 7.8 g/dL (ref 6.5–8.1)

## 2023-01-26 LAB — LIPASE, BLOOD: Lipase: 34 U/L (ref 11–51)

## 2023-01-26 MED ORDER — ONDANSETRON 4 MG PO TBDP: 4.0000 mg | ORAL_TABLET | Freq: Three times a day (TID) | ORAL | 0 refills | Status: DC | PRN

## 2023-01-26 MED ORDER — KETOROLAC TROMETHAMINE 15 MG/ML IJ SOLN
15.0000 mg | Freq: Once | INTRAMUSCULAR | Status: AC
  Administered 2023-01-26: 15 mg via INTRAMUSCULAR
  Filled 2023-01-26: qty 1

## 2023-01-26 MED ORDER — DICYCLOMINE HCL 20 MG PO TABS: 20.0000 mg | ORAL_TABLET | Freq: Four times a day (QID) | ORAL | 0 refills | Status: DC | PRN

## 2023-01-26 MED ORDER — ONDANSETRON 4 MG PO TBDP
4.0000 mg | ORAL_TABLET | Freq: Once | ORAL | Status: AC
  Administered 2023-01-26: 4 mg via ORAL
  Filled 2023-01-26: qty 1

## 2023-01-26 NOTE — ED Triage Notes (Signed)
 Pt via POV from home. Pt c/o generalized abd pain, nausea, and headache that started this AM. Denies vomiting or diarrhea. Denies fever. Denies any abd surgeries. Pt is A&OX4 and NAD, ambulatory to triage at this time.

## 2023-01-26 NOTE — ED Notes (Signed)
 Pt given water for PO challenge per order,

## 2023-01-26 NOTE — ED Provider Notes (Signed)
 [facility] Provider Note    Event Date/Time   First MD Initiated Contact with Patient 01/26/23 [phone]     (approximate)   History   Chief Complaint: Abdominal Pain   HPI  Cassandra Conley is a 53 y.o. female with a history of diabetes who comes ED complaining of generalized abdominal pain, nausea, bilateral frontal headache that started this morning.  No vomiting or diarrhea.  No fever.  Reports she was in her usual state of health last night when she went to bed.  No unusual foods for dinner.  No chest pain or shortness of breath.  She also endorses a nonproductive cough.          Physical Exam   Triage Vital Signs: ED Triage Vitals  Encounter Vitals Group     BP 01/26/23 0712 (!) 147/91     Systolic BP Percentile --      Diastolic BP Percentile --      Pulse Rate 01/26/23 0712 84     Resp 01/26/23 0712 19     Temp 01/26/23 0712 97.7 F (36.5 C)     Temp Source 01/26/23 0712 Oral     SpO2 01/26/23 0712 99 %     Weight 01/26/23 0717 195 lb (88.5 kg)     Height 01/26/23 0717 5\' 5"  (1.651 m)     Head Circumference --      Peak Flow --      Pain Score 01/26/23 0717 10     Pain Loc --      Pain Education --      Exclude from Growth Chart --     Most recent vital signs: Vitals:   01/26/23 0712  BP: (!) 147/91  Pulse: 84  Resp: 19  Temp: 97.7 F (36.5 C)  SpO2: 99%    General: Awake, no distress.  CV:  Good peripheral perfusion.  Regular rate rhythm Resp:  Normal effort.  Clear to auscultation bilaterally Abd:  No distention.  No focal tenderness.  No peritoneal signs. Other:  Moist oral mucosa.  No lower extremity edema, no rash   ED Results / Procedures / Treatments   Labs (all labs ordered are listed, but only abnormal results are displayed) Labs Reviewed  COMPREHENSIVE METABOLIC PANEL - Abnormal; Notable for the following components:      Result Value   Sodium 133 (*)    Glucose, Bld 288 (*)    Alkaline Phosphatase  128 (*)    All other components within normal limits  LIPASE, BLOOD  CBC  URINALYSIS, ROUTINE W REFLEX MICROSCOPIC     EKG    RADIOLOGY    PROCEDURES:  Procedures   MEDICATIONS ORDERED IN ED: Medications  ondansetron  (ZOFRAN -ODT) disintegrating tablet 4 mg (4 mg Oral Given 01/26/23 0837)  ketorolac  (TORADOL ) 15 MG/ML injection 15 mg (15 mg Intramuscular Given 01/26/23 0837)     IMPRESSION / MDM / ASSESSMENT AND PLAN / ED COURSE  I reviewed the triage vital signs and the nursing notes.  DDx: Viral illness, AKI, hyperglycemia, electrolyte derangement, pancreatitis, UTI  Patient's presentation is most consistent with acute presentation with potential threat to life or bodily function.  Patient presents with generalized abdominal pain and headache and malaise and nausea and nonproductive cough.  Highly suspicious for a viral syndrome.  Will give Toradol  and Zofran  and p.o. trial.   Considering the patient's symptoms, medical history, and physical examination today, I have low suspicion for ischemic stroke, intracranial  hemorrhage, meningitis, encephalitis, carotid or vertebral dissection, venous sinus thrombosis, MS, intracranial hypertension, glaucoma, CRAO, CRVO, or temporal arteritis. low suspicion for cholecystitis or biliary pathology, perforation or bowel obstruction, hernia, intra-abdominal abscess, AAA or dissection, volvulus or intussusception, mesenteric ischemia, or appendicitis.   ----------------------------------------- 9:37 AM on 01/26/2023 ----------------------------------------- Feeling better.  Tolerating oral intake.  Stable for discharge       FINAL CLINICAL IMPRESSION(S) / ED DIAGNOSES   Final diagnoses:  Generalized abdominal pain  Acute viral syndrome     Rx / DC Orders   ED Discharge Orders          Ordered    dicyclomine  (BENTYL ) 20 MG tablet  Every 6 hours PRN        01/26/23 0936    ondansetron  (ZOFRAN -ODT) 4 MG disintegrating  tablet  Every 8 hours PRN        01/26/23 0936             Note:  This document was prepared using Dragon voice recognition software and may include unintentional dictation errors.   [name], MD 01/26/23 [phone]

## 2023-01-26 NOTE — ED Notes (Signed)
 Pt presents to ED with dysuria and increased frequency for the past few weeks pt states HX of UTI in the past. Pt denies flank pain. Pt denies fevers or chills.   Pt states she has been using "holistic OTC meds" to help relieve s/s but states it has not improved much.

## 2023-01-26 NOTE — Discharge Instructions (Addendum)
 Take ibuprofen 600mg  every 6 hours as needed.   Dicyclomine  can help with pain and diarrhea. Ondansetron  can help with nausea.

## 2023-01-26 NOTE — ED Notes (Signed)
 See triage note:  Pt states no injury to head due to HA. Pt unsure of sick contacts. NAD noted.

## 2023-02-25 ENCOUNTER — Emergency Department
Admission: EM | Admit: 2023-02-25 | Discharge: 2023-02-25 | Disposition: A | Discharge status: Home / Self Care | Payer: Medicaid Other | Attending: Emergency Medicine | Admitting: Emergency Medicine

## 2023-02-25 ENCOUNTER — Other Ambulatory Visit: Payer: Self-pay

## 2023-02-25 DIAGNOSIS — J101 Influenza due to other identified influenza virus with other respiratory manifestations: Secondary | ICD-10-CM | POA: Diagnosis not present

## 2023-02-25 DIAGNOSIS — R059 Cough, unspecified: Secondary | ICD-10-CM | POA: Diagnosis present

## 2023-02-25 LAB — CBG MONITORING, ED: Glucose-Capillary: 294 mg/dL — ABNORMAL HIGH (ref 70–99)

## 2023-02-25 LAB — RESP PANEL BY RT-PCR (RSV, FLU A&B, COVID)  RVPGX2
Influenza A by PCR: POSITIVE — AB
Influenza B by PCR: NEGATIVE
Resp Syncytial Virus by PCR: NEGATIVE
SARS Coronavirus 2 by RT PCR: NEGATIVE

## 2023-02-25 MED ORDER — ONDANSETRON 4 MG PO TBDP
4.0000 mg | ORAL_TABLET | Freq: Once | ORAL | Status: AC
  Administered 2023-02-25: 4 mg via ORAL
  Filled 2023-02-25: qty 1

## 2023-02-25 MED ORDER — ONDANSETRON 4 MG PO TBDP: 4.0000 mg | ORAL_TABLET | Freq: Three times a day (TID) | ORAL | 0 refills | Status: DC | PRN

## 2023-02-25 MED ORDER — NAPROXEN 500 MG PO TABS: 500.0000 mg | ORAL_TABLET | Freq: Two times a day (BID) | ORAL | 0 refills | Status: AC

## 2023-02-25 MED ORDER — NAPROXEN 500 MG PO TABS
500.0000 mg | ORAL_TABLET | Freq: Once | ORAL | Status: AC
  Administered 2023-02-25: 500 mg via ORAL
  Filled 2023-02-25: qty 1

## 2023-02-25 MED ORDER — GUAIFENESIN-CODEINE 100-10 MG/5ML PO SOLN: 10.0000 mL | Freq: Three times a day (TID) | ORAL | 0 refills | Status: DC | PRN

## 2023-02-25 NOTE — ED Provider Notes (Signed)
[facility] Provider Note    Event Date/Time   First MD Initiated Contact with Patient 02/25/23 1024     (approximate)   History   Cough   HPI  Cassandra Conley is a 53 y.o. female  with no significant past medical history and as listed in EMR presents to the emergency department for treatment and evaluation of nausea, cough and headache for the past week. No known fever.       Physical Exam   Triage Vital Signs: ED Triage Vitals  Encounter Vitals Group     BP 02/25/23 1003 (!) 160/93     Systolic BP Percentile --      Diastolic BP Percentile --      Pulse Rate 02/25/23 1003 (!) 102     Resp 02/25/23 1003 19     Temp 02/25/23 1003 98.4 F (36.9 C)     Temp Source 02/25/23 1003 Oral     SpO2 02/25/23 1003 97 %     Weight 02/25/23 1004 195 lb (88.5 kg)     Height 02/25/23 1004 5\' 5"  (1.651 m)     Head Circumference --      Peak Flow --      Pain Score 02/25/23 1004 8     Pain Loc --      Pain Education --      Exclude from Growth Chart --     Most recent vital signs: Vitals:   02/25/23 1003  BP: (!) 160/93  Pulse: (!) 102  Resp: 19  Temp: 98.4 F (36.9 C)  SpO2: 97%    General: Awake, no distress.  CV:  Good peripheral perfusion.  Resp:  Normal effort.  Abd:  No distention.  Other:     ED Results / Procedures / Treatments   Labs (all labs ordered are listed, but only abnormal results are displayed) Labs Reviewed  RESP PANEL BY RT-PCR (RSV, FLU A&B, COVID)  RVPGX2 - Abnormal; Notable for the following components:      Result Value   Influenza A by PCR POSITIVE (*)    All other components within normal limits  CBG MONITORING, ED - Abnormal; Notable for the following components:   Glucose-Capillary 294 (*)    All other components within normal limits     EKG    RADIOLOGY  Image and radiology report reviewed and interpreted by me. Radiology report consistent with the same.  N/a  PROCEDURES:  Critical Care  performed: No  Procedures   MEDICATIONS ORDERED IN ED:  Medications  ondansetron (ZOFRAN-ODT) disintegrating tablet 4 mg (4 mg Oral Given 02/25/23 1159)  naproxen (NAPROSYN) tablet 500 mg (500 mg Oral Given 02/25/23 1200)     IMPRESSION / MDM / ASSESSMENT AND PLAN / ED COURSE   I have reviewed the triage note.  Differential diagnosis includes, but is not limited to, COVID, influenza, gastroenteritis  Patient's presentation is most consistent with acute illness / injury with system symptoms.  53 year old female presenting to the emergency department for treatment and evaluation of headache, nausea, cough.  See HPI for further details.  Influenza A positive. Results discussed with the patient who now tells me she has been vomiting in addition to other complaints. She is an insulin dependant diabetic, so I had planned on getting labs however she just wanted to go home. CBG is 294. She was advised to take her insulin as prescribed and return to the ER for any concerns. Zofran and naprosyn  sent to her pharmacy.       FINAL CLINICAL IMPRESSION(S) / ED DIAGNOSES   Final diagnoses:  Influenza A     Rx / DC Orders   ED Discharge Orders          Ordered    ondansetron (ZOFRAN-ODT) 4 MG disintegrating tablet  Every 8 hours PRN        02/25/23 1120    naproxen (NAPROSYN) 500 MG tablet  2 times daily with meals        02/25/23 1120    guaiFENesin-codeine 100-10 MG/5ML syrup  3 times daily PRN        02/25/23 1122             Note:  This document was prepared using Dragon voice recognition software and may include unintentional dictation errors.   Chinita Pester, FNP 02/25/23 1233    Jene Every, MD 02/25/23 1234

## 2023-02-25 NOTE — ED Notes (Signed)
Patient states she has not taken her diabetes meds today.

## 2023-02-25 NOTE — ED Triage Notes (Signed)
Pt sts that pt has been having cough with a head ache for the last week. Pt sts that she went to a clinic and was given a medication and it has not helped.

## 2023-03-10 ENCOUNTER — Emergency Department: Payer: Medicaid Other

## 2023-03-10 ENCOUNTER — Emergency Department
Admission: EM | Admit: 2023-03-10 | Discharge: 2023-03-10 | Disposition: A | Discharge status: Home / Self Care | Payer: Medicaid Other | Attending: Emergency Medicine | Admitting: Emergency Medicine

## 2023-03-10 ENCOUNTER — Encounter: Payer: Self-pay | Admitting: *Deleted

## 2023-03-10 ENCOUNTER — Other Ambulatory Visit: Payer: Self-pay

## 2023-03-10 DIAGNOSIS — J029 Acute pharyngitis, unspecified: Secondary | ICD-10-CM | POA: Diagnosis present

## 2023-03-10 DIAGNOSIS — R519 Headache, unspecified: Secondary | ICD-10-CM | POA: Insufficient documentation

## 2023-03-10 DIAGNOSIS — Z794 Long term (current) use of insulin: Secondary | ICD-10-CM | POA: Insufficient documentation

## 2023-03-10 DIAGNOSIS — R059 Cough, unspecified: Secondary | ICD-10-CM | POA: Diagnosis not present

## 2023-03-10 LAB — RESP PANEL BY RT-PCR (RSV, FLU A&B, COVID)  RVPGX2
Influenza A by PCR: NEGATIVE
Influenza B by PCR: NEGATIVE
Resp Syncytial Virus by PCR: NEGATIVE
SARS Coronavirus 2 by RT PCR: NEGATIVE

## 2023-03-10 LAB — GROUP A STREP BY PCR: Group A Strep by PCR: NOT DETECTED

## 2023-03-10 MED ORDER — AMOXICILLIN-POT CLAVULANATE 875-125 MG PO TABS: 1.0000 | ORAL_TABLET | Freq: Two times a day (BID) | ORAL | 0 refills | Status: AC

## 2023-03-10 MED ORDER — AMOXICILLIN-POT CLAVULANATE 875-125 MG PO TABS
1.0000 | ORAL_TABLET | Freq: Once | ORAL | Status: AC
  Administered 2023-03-10: 1 via ORAL
  Filled 2023-03-10: qty 1

## 2023-03-10 NOTE — ED Provider Notes (Signed)
 [facility] AT [facility] Provider Note   CSN: [id number] Arrival date & time: 03/10/23  1937     History  Chief Complaint  Patient presents with   Sore Throat    Cassandra Conley is a 53 y.o. female.  Patient here for sore throat and flulike symptoms for 1 week.  Cough headache sore throat.  No fevers or chills.  No difficulty breathing or swallowing but it is painful when swallowing.  History collected with aid of virtual interpreter.   Sore Throat Associated symptoms include headaches. Pertinent negatives include no chest pain, no abdominal pain and no shortness of breath.       Home Medications Prior to Admission medications   Medication Sig Start Date End Date Taking? Authorizing Provider  amoxicillin-clavulanate (AUGMENTIN) 875-125 MG tablet Take 1 tablet by mouth 2 (two) times daily for 10 days. 03/10/23 03/20/23 Yes Maegen Wigle, Gregary Signs, PA-C  acetaminophen-codeine (TYLENOL #3) 300-30 MG tablet Take 1-2 tablets by mouth every 6 (six) hours as needed for moderate pain. 04/22/22   Triplett, Cari B, FNP  albuterol (PROAIR HFA) 108 (90 Base) MCG/ACT inhaler INHALE 2 PUFFS BY MOUTH ONCE EVERY 4 HOURS AS DIRECTED FOR COUGH. 04/06/21     azithromycin (ZITHROMAX) 500 MG tablet Take 2 tablets (1000mg  total) by mouth once as a single dose for chlamydia. 04/02/21     Blood Glucose Monitoring Suppl (RIGHTEST GM550 BLOOD GLUCOSE) w/Device KIT USE AS DIRECTED TO CHECK BLOOD SUGAR 4 TIMES DAILY. 04/07/21     dicyclomine (BENTYL) 20 MG tablet Take 1 tablet (20 mg total) by mouth every 6 (six) hours as needed for up to 5 days for spasms. 01/26/23 01/31/23  Sharman Cheek, MD  empagliflozin (JARDIANCE) 10 MG TABS tablet Take one tablet by mouth daily 10/12/21   Oswaldo Conroy, MD  fluticasone (FLONASE) 50 MCG/ACT nasal spray Spray 1 spray into both nostrils once a day for allergies 10/02/20     glucose blood (RIGHTEST GS550 BLOOD GLUCOSE) test strip USE AS DIRECTED  TO CHECK BLOOD SUGAR 4 TIMES DAILY. 04/07/21     guaiFENesin-codeine 100-10 MG/5ML syrup Take 10 mLs by mouth 3 (three) times daily as needed. 02/25/23   Triplett, Rulon Eisenmenger B, FNP  Insulin Glargine (BASAGLAR KWIKPEN) 100 UNIT/ML Inject 10 units subcutaneously once every day for diabetes. 04/03/21     Insulin Pen Needle (COMFORT EZ PEN NEEDLES) 32G X 4 MM MISC Use as directed daily. 04/02/21     metFORMIN (GLUCOPHAGE-XR) 500 MG 24 hr tablet Take 2 tablets (1,000 mg total) by mouth 2 (two) times daily. 05/12/21     naproxen (NAPROSYN) 500 MG tablet Take 1 tablet (500 mg total) by mouth 2 (two) times daily with a meal. 02/25/23   Triplett, Cari B, FNP  ondansetron (ZOFRAN-ODT) 4 MG disintegrating tablet Take 1 tablet (4 mg total) by mouth every 8 (eight) hours as needed for nausea or vomiting. 02/25/23   Triplett, Cari B, FNP  Rightest GL300 Lancets MISC USE AS DIRECTED TO CHECK BLOOD SUGAR 4 TIMES DAILY. 04/07/21         Allergies    Patient has no known allergies.    Review of Systems   Review of Systems  Constitutional:  Negative for chills and fever.  HENT:  Positive for sore throat. Negative for ear pain and trouble swallowing.   Respiratory:  Negative for shortness of breath.   Cardiovascular:  Negative for chest pain.  Gastrointestinal:  Negative for abdominal pain.  Neurological:  Positive for headaches. Negative for weakness.    Physical Exam Updated Vital Signs BP 138/78 (BP Location: Right Arm)   Pulse 99   Temp 98.4 F (36.9 C) (Oral)   Resp 18   Ht 5\' 6"  (1.676 m)   Wt 83.9 kg   SpO2 100%   BMI 29.86 kg/m  Physical Exam Vitals and nursing note reviewed.  Constitutional:      General: She is not in acute distress.    Appearance: She is well-developed.  HENT:     Head: Normocephalic and atraumatic.     Mouth/Throat:     Comments: OP is clear although mild left tonsillar edema no exudate.  No submandibular swelling no oral obstruction. Eyes:     Conjunctiva/sclera: Conjunctivae  normal.  Cardiovascular:     Rate and Rhythm: Normal rate and regular rhythm.     Heart sounds: No murmur heard. Pulmonary:     Effort: Pulmonary effort is normal. No respiratory distress.     Breath sounds: Normal breath sounds.  Abdominal:     Palpations: Abdomen is soft.     Tenderness: There is no abdominal tenderness.  Musculoskeletal:        General: No swelling.     Cervical back: Neck supple.  Skin:    General: Skin is warm and dry.     Capillary Refill: Capillary refill takes less than 2 seconds.  Neurological:     Mental Status: She is alert.  Psychiatric:        Mood and Affect: Mood normal.     ED Results / Procedures / Treatments   Labs (all labs ordered are listed, but only abnormal results are displayed) Labs Reviewed  GROUP A STREP BY PCR  RESP PANEL BY RT-PCR (RSV, FLU A&B, COVID)  RVPGX2    EKG None  Radiology DG Chest 2 View Result Date: 03/10/2023 CLINICAL DATA:  Sore throat and flu-like symptoms with cough and headache EXAM: CHEST - 2 VIEW COMPARISON:  None Available. FINDINGS: The heart size and mediastinal contours are within normal limits. Both lungs are clear. The visualized skeletal structures are unremarkable. IMPRESSION: No active cardiopulmonary disease. Electronically Signed   By: Minerva Fester M.D.   On: 03/10/2023 21:46    Procedures Procedures    Medications Ordered in ED Medications  amoxicillin-clavulanate (AUGMENTIN) 875-125 MG per tablet 1 tablet (has no administration in time range)    ED Course/ Medical Decision Making/ A&P                                 Medical Decision Making Patient here for sore throat and flulike symptoms.  No evidence of obstruction no Ludwicg's.  Negative COVID flu RSV and strep.  Chest x-ray is clear does not show any evidence of pneumonia.  Given patient's continued sore throat and clinical exam I will treat empirically for bacterial pharyngitis.  Recommend symptomatic treatment otherwise and  follow-up with primary.  Given return precautions here.  Amount and/or Complexity of Data Reviewed Radiology: ordered.  Risk Prescription drug management.           Final Clinical Impression(s) / ED Diagnoses Final diagnoses:  Pharyngitis, unspecified etiology    Rx / DC Orders ED Discharge Orders          Ordered    amoxicillin-clavulanate (AUGMENTIN) 875-125 MG tablet  2 times daily        03/10/23 2241  Christen Bame, PA-C 03/10/23 2241    Dionne Bucy, MD 03/10/23 [phone]

## 2023-03-10 NOTE — ED Provider Triage Note (Signed)
 Emergency Medicine Provider Triage Evaluation Note  Cassandra Conley , a 53 y.o. female  was evaluated in triage.  Pt complains of sore throat flulike symptoms with cough headache for about 1 week.  No difficulty breathing..  Review of Systems  Positive:  Negative:   Physical Exam  Ht 5\' 6"  (1.676 m)   Wt 83.9 kg   BMI 29.86 kg/m  Gen:   Awake, no distress   Resp:  Normal effort  MSK:   Moves extremities without difficulty  Other:    Medical Decision Making  Medically screening exam initiated at 7:47 PM.  Appropriate orders placed.  Cassandra Conley was informed that the remainder of the evaluation will be completed by another provider, this initial triage assessment does not replace that evaluation, and the importance of remaining in the ED until their evaluation is complete.  1 week of flulike symptoms.   Cassandra Conley, [place] 03/10/23 [phone]

## 2023-03-10 NOTE — ED Triage Notes (Signed)
 Pt ambulatory to triage.  Pt reports a cough for 1 week.  Pt also has headache and sore throat.  Interpreter on a stick used in triage.  Pt alert.

## 2023-04-19 ENCOUNTER — Other Ambulatory Visit: Payer: Self-pay

## 2023-04-19 ENCOUNTER — Emergency Department
Admission: EM | Admit: 2023-04-19 | Discharge: 2023-04-19 | Disposition: A | Discharge status: Home / Self Care | Attending: Emergency Medicine | Admitting: Emergency Medicine

## 2023-04-19 ENCOUNTER — Emergency Department

## 2023-04-19 ENCOUNTER — Encounter: Payer: Self-pay | Admitting: Emergency Medicine

## 2023-04-19 DIAGNOSIS — M7022 Olecranon bursitis, left elbow: Secondary | ICD-10-CM | POA: Insufficient documentation

## 2023-04-19 DIAGNOSIS — E119 Type 2 diabetes mellitus without complications: Secondary | ICD-10-CM | POA: Diagnosis not present

## 2023-04-19 DIAGNOSIS — Y9389 Activity, other specified: Secondary | ICD-10-CM | POA: Diagnosis not present

## 2023-04-19 DIAGNOSIS — M7032 Other bursitis of elbow, left elbow: Secondary | ICD-10-CM

## 2023-04-19 DIAGNOSIS — M25522 Pain in left elbow: Secondary | ICD-10-CM | POA: Diagnosis present

## 2023-04-19 MED ORDER — MELOXICAM 15 MG PO TABS: 15.0000 mg | ORAL_TABLET | Freq: Every day | ORAL | 2 refills | Status: AC

## 2023-04-19 MED ORDER — NAPROXEN 500 MG PO TABS
500.0000 mg | ORAL_TABLET | Freq: Once | ORAL | Status: AC
  Administered 2023-04-19: 500 mg via ORAL
  Filled 2023-04-19: qty 1

## 2023-04-19 NOTE — ED Notes (Signed)
 See triage note  Presents with left elbow pain States pain started about 3 days ago w/o known injury  Good pulses

## 2023-04-19 NOTE — ED Triage Notes (Signed)
 Patient to ED via POV for left arm pain. Pt states pain around left elbow. Ongoing x3 days. No known injury.

## 2023-04-19 NOTE — ED Provider Notes (Signed)
 [facility] Provider Note    Event Date/Time   First MD Initiated Contact with Patient 04/19/23 1037     (approximate)   History   Arm Pain   HPI  Cassandra Conley is a 53 y.o. female with history of diabetes, presents emergency department complaint of left elbow pain.  Patient states she works at [employer] and does repetitive motions.  Pain started on Sunday and she noticed small amount of swelling at the elbow.  Pain with range of motion.  No numbness or tingling.     Physical Exam   Triage Vital Signs: ED Triage Vitals [04/19/23 1006]  Encounter Vitals Group     BP (!) 126/102     Systolic BP Percentile      Diastolic BP Percentile      Pulse Rate 77     Resp 17     Temp 97.9 F (36.6 C)     Temp Source Oral     SpO2 98 %     Weight 184 lb 15.5 oz (83.9 kg)     Height 5\' 6"  (1.676 m)     Head Circumference      Peak Flow      Pain Score 10     Pain Loc      Pain Education      Exclude from Growth Chart     Most recent vital signs: Vitals:   04/19/23 1006  BP: (!) 126/102  Pulse: 77  Resp: 17  Temp: 97.9 F (36.6 C)  SpO2: 98%     General: Awake, no distress.   CV:  Good peripheral perfusion. regular rate and  rhythm Resp:  Normal effort.  Abd:  No distention.   Other:  Left elbow with swelling noted on the dorsum near the the proximal ulna, area is tender to palpation, pain is reproduced with range of motion, neurovascular is intact   ED Results / Procedures / Treatments   Labs (all labs ordered are listed, but only abnormal results are displayed) Labs Reviewed - No data to display   EKG     RADIOLOGY X-ray of the left elbow    PROCEDURES:   Procedures Chief Complaint  Patient presents with   Arm Pain      MEDICATIONS ORDERED IN ED: Medications  naproxen (NAPROSYN) tablet 500 mg (has no administration in time range)     IMPRESSION / MDM / ASSESSMENT AND PLAN / ED COURSE  I reviewed  the triage vital signs and the nursing notes.                              Differential diagnosis includes, but is not limited to, bursitis, tendinitis, strain, fracture  Patient's presentation is most consistent with acute complicated illness / injury requiring diagnostic workup.   X-ray left elbow independently reviewed interpreted by me as being negative for acute abnormality, radiology read pending  The patient was given Naprosyn 500 mg p.o. here in the ED.  She be given a prescription for meloxicam.  Do feel this is more of a bursitis.  Area is swollen and tender.  She be placed in an Ace wrap and a sling.  Follow-up with orthopedics not improved in 1 week.  Work restrictions given for she will not use the left arm for 1 week.  She is in agreement treatment plan.  Did discuss RICE.  Discharged stable condition.  FINAL CLINICAL IMPRESSION(S) / ED DIAGNOSES   Final diagnoses:  Bursitis of left elbow, unspecified bursa     Rx / DC Orders   ED Discharge Orders          Ordered    meloxicam (MOBIC) 15 MG tablet  Daily        04/19/23 1051             Note:  This document was prepared using Dragon voice recognition software and may include unintentional dictation errors.    Faythe Ghee, PA-C 04/19/23 1104    Chesley Noon, MD 04/19/23 [phone]

## 2023-05-22 ENCOUNTER — Emergency Department
Admission: EM | Admit: 2023-05-22 | Discharge: 2023-05-22 | Disposition: A | Discharge status: Home / Self Care | Attending: Emergency Medicine | Admitting: Emergency Medicine

## 2023-05-22 ENCOUNTER — Encounter: Payer: Self-pay | Admitting: Emergency Medicine

## 2023-05-22 ENCOUNTER — Other Ambulatory Visit: Payer: Self-pay

## 2023-05-22 DIAGNOSIS — E119 Type 2 diabetes mellitus without complications: Secondary | ICD-10-CM | POA: Insufficient documentation

## 2023-05-22 DIAGNOSIS — H1131 Conjunctival hemorrhage, right eye: Secondary | ICD-10-CM | POA: Diagnosis not present

## 2023-05-22 DIAGNOSIS — H5789 Other specified disorders of eye and adnexa: Secondary | ICD-10-CM | POA: Diagnosis present

## 2023-05-22 MED ORDER — TETRACAINE HCL 0.5 % OP SOLN
2.0000 [drp] | Freq: Once | OPHTHALMIC | Status: AC
  Administered 2023-05-22: 2 [drp] via OPHTHALMIC
  Filled 2023-05-22: qty 4

## 2023-05-22 MED ORDER — FLUORESCEIN SODIUM 1 MG OP STRP
1.0000 | ORAL_STRIP | Freq: Once | OPHTHALMIC | Status: AC
  Administered 2023-05-22: 1 via OPHTHALMIC
  Filled 2023-05-22: qty 1

## 2023-05-22 MED ORDER — TOBRAMYCIN 0.3 % OP SOLN: 1.0000 [drp] | OPHTHALMIC | 0 refills | Status: AC

## 2023-05-22 NOTE — ED Provider Notes (Signed)
 [facility] Emergency Department Provider Note     Event Date/Time   First MD Initiated Contact with Patient 05/22/23 2046     (approximate)   History   Eye Problem   HPI  Cassandra Conley is a 53 y.o. female with a history of diabetes presents to the ED for evaluation of redness to the right thigh noted upon waking up this morning.  Patient denies injury or trauma.  Describes a burning sensation but denies pain, foreign body, discharge, and visual changes.  Denies fever and chills or systemic symptoms.      Physical Exam   Triage Vital Signs: ED Triage Vitals  Encounter Vitals Group     BP 05/22/23 2033 (!) 145/89     Systolic BP Percentile --      Diastolic BP Percentile --      Pulse Rate 05/22/23 2033 84     Resp 05/22/23 2033 16     Temp 05/22/23 2033 98.5 F (36.9 C)     Temp Source 05/22/23 2033 Oral     SpO2 05/22/23 2033 96 %     Weight 05/22/23 2034 185 lb (83.9 kg)     Height 05/22/23 2034 5\' 6"  (1.676 m)     Head Circumference --      Peak Flow --      Pain Score 05/22/23 2034 8     Pain Loc --      Pain Education --      Exclude from Growth Chart --     Most recent vital signs: Vitals:   05/22/23 2033  BP: (!) 145/89  Pulse: 84  Resp: 16  Temp: 98.5 F (36.9 C)  SpO2: 96%    General Awake, no distress.  HEENT NCAT. PERRL. EOMI without pain.  Will edema [place] subconjunctival hemorrhage noted to right eye.  No conjunctival injection beyond hemorrhage.  No swelling. CV:  Good peripheral perfusion.  RESP:  Normal effort.  ABD:  No distention.  Other:  Fluorescein staining performed under lamp with no evidence of corneal abrasion or ulceration.   ED Results / Procedures / Treatments   Labs (all labs ordered are listed, but only abnormal results are displayed) Labs Reviewed - No data to display  No results found.  PROCEDURES:  Critical Care performed: No  Procedures  MEDICATIONS ORDERED IN  ED: Medications  tetracaine (PONTOCAINE) 0.5 % ophthalmic solution 2 drop (2 drops Right Eye Given by Other 05/22/23 2259)  fluorescein ophthalmic strip 1 strip (1 strip Right Eye Given by Other 05/22/23 2258)   IMPRESSION / MDM / ASSESSMENT AND PLAN / ED COURSE  I reviewed the triage vital signs and the nursing notes.                                 53 y.o. female presents to the emergency department for evaluation and treatment of acute eye redness without injury. See HPI for further details.   Differential diagnosis includes, but is not limited to subconjunctival hemorrhage, corneal abrasion, foreign body, conjunctivitis  Patient's presentation is most consistent with acute complicated illness / injury requiring diagnostic workup.  Patient is alert and oriented.  She is hemodynamically stable.  Physical exam findings are clinically consistent with subconjunctival hemorrhage likely spontaneous with no trauma given history.  There are no signs of corneal abrasion or foreign body due to fluorescein lamp.  Reassurance was provided  to patient.  Prescribed antibiotic eyedrops to cover for infection.  Discussed with patient that this is self-limiting and should resolve on its own.  Encouraged follow-up with ophthalmology if symptoms worsen.  ED return precautions discussed.  Patient stable condition for discharge home.   FINAL CLINICAL IMPRESSION(S) / ED DIAGNOSES   Final diagnoses:  Subconjunctival hemorrhage of right eye     Rx / DC Orders   ED Discharge Orders          Ordered    tobramycin (TOBREX) 0.3 % ophthalmic solution  Every 4 hours        05/22/23 2233             Note:  This document was prepared using Dragon voice recognition software and may include unintentional dictation errors.    Phyllis Breeze, Tristram Milian A, PA-C 05/22/23 2349    Kandee Orion, MD 05/23/23 [phone]

## 2023-05-22 NOTE — Discharge Instructions (Signed)
 Your physical exam findings are consistent with conjunctival hemorrhage.  Please see patient education attached to your discharge papers for more information.  If symptoms not improved in 7 days please follow-up with ophthalmology.  Call and schedule appointment with Dr. Merrell Abate.

## 2023-05-22 NOTE — ED Triage Notes (Signed)
 Interpreter used for triage, pt reports waking up with redness and burning to her right eye, denies injury

## 2023-06-12 ENCOUNTER — Other Ambulatory Visit: Payer: Self-pay

## 2023-06-12 ENCOUNTER — Emergency Department

## 2023-06-12 ENCOUNTER — Emergency Department
Admission: EM | Admit: 2023-06-12 | Discharge: 2023-06-12 | Disposition: A | Discharge status: Home / Self Care | Attending: Emergency Medicine | Admitting: Emergency Medicine

## 2023-06-12 DIAGNOSIS — R1084 Generalized abdominal pain: Secondary | ICD-10-CM

## 2023-06-12 DIAGNOSIS — E119 Type 2 diabetes mellitus without complications: Secondary | ICD-10-CM | POA: Insufficient documentation

## 2023-06-12 DIAGNOSIS — K2900 Acute gastritis without bleeding: Secondary | ICD-10-CM | POA: Diagnosis not present

## 2023-06-12 LAB — COMPREHENSIVE METABOLIC PANEL WITH GFR
ALT: 24 U/L (ref 0–44)
AST: 21 U/L (ref 15–41)
Albumin: 3.4 g/dL — ABNORMAL LOW (ref 3.5–5.0)
Alkaline Phosphatase: 108 U/L (ref 38–126)
Anion gap: 7 (ref 5–15)
BUN: 12 mg/dL (ref 6–20)
CO2: 23 mmol/L (ref 22–32)
Calcium: 8.7 mg/dL — ABNORMAL LOW (ref 8.9–10.3)
Chloride: 100 mmol/L (ref 98–111)
Creatinine, Ser: 0.61 mg/dL (ref 0.44–1.00)
GFR, Estimated: >60 mL/min (ref >60)
Glucose, Bld: 315 mg/dL — ABNORMAL HIGH (ref 70–99)
Potassium: 3.9 mmol/L (ref 3.5–5.1)
Sodium: 130 mmol/L — ABNORMAL LOW (ref 135–145)
Total Bilirubin: 0.6 mg/dL (ref 0.0–1.2)
Total Protein: 7.1 g/dL (ref 6.5–8.1)

## 2023-06-12 LAB — URINALYSIS, ROUTINE W REFLEX MICROSCOPIC
Bacteria, UA: NONE SEEN
Bilirubin Urine: NEGATIVE
Glucose, UA: >=500 mg/dL — AB
Hgb urine dipstick: NEGATIVE
Ketones, ur: NEGATIVE mg/dL
Leukocytes,Ua: NEGATIVE
Nitrite: NEGATIVE
Protein, ur: NEGATIVE mg/dL
Specific Gravity, Urine: 1.027 (ref 1.005–1.030)
pH: 6 (ref 5.0–8.0)

## 2023-06-12 LAB — CBC
HCT: 37.3 % (ref 36.0–46.0)
Hemoglobin: 12.5 g/dL (ref 12.0–15.0)
MCH: 26.8 pg (ref 26.0–34.0)
MCHC: 33.5 g/dL (ref 30.0–36.0)
MCV: 79.9 fL — ABNORMAL LOW (ref 80.0–100.0)
Platelets: 205 10*3/uL (ref 150–400)
RBC: 4.67 MIL/uL (ref 3.87–5.11)
RDW: 13.2 % (ref 11.5–15.5)
WBC: 8.1 10*3/uL (ref 4.0–10.5)
nRBC: 0 % (ref 0.0–0.2)

## 2023-06-12 LAB — LIPASE, BLOOD: Lipase: 31 U/L (ref 11–51)

## 2023-06-12 MED ORDER — IOHEXOL 300 MG/ML  SOLN
100.0000 mL | Freq: Once | INTRAMUSCULAR | Status: AC | PRN
  Administered 2023-06-12: 100 mL via INTRAVENOUS

## 2023-06-12 MED ORDER — ONDANSETRON HCL 4 MG/2ML IJ SOLN
4.0000 mg | Freq: Once | INTRAMUSCULAR | Status: AC
  Administered 2023-06-12: 4 mg via INTRAVENOUS
  Filled 2023-06-12: qty 2

## 2023-06-12 MED ORDER — DICYCLOMINE HCL 20 MG PO TABS: 20.0000 mg | ORAL_TABLET | Freq: Four times a day (QID) | ORAL | 0 refills | Status: AC | PRN

## 2023-06-12 MED ORDER — ONDANSETRON 4 MG PO TBDP: 4.0000 mg | ORAL_TABLET | Freq: Three times a day (TID) | ORAL | 0 refills | Status: AC | PRN

## 2023-06-12 MED ORDER — KETOROLAC TROMETHAMINE 15 MG/ML IJ SOLN
15.0000 mg | Freq: Once | INTRAMUSCULAR | Status: AC
  Administered 2023-06-12: 15 mg via INTRAVENOUS
  Filled 2023-06-12: qty 1

## 2023-06-12 MED ORDER — SODIUM CHLORIDE 0.9 % IV BOLUS
1000.0000 mL | Freq: Once | INTRAVENOUS | Status: AC
  Administered 2023-06-12: 1000 mL via INTRAVENOUS

## 2023-06-12 MED ORDER — FAMOTIDINE 20 MG PO TABS: 20.0000 mg | ORAL_TABLET | Freq: Two times a day (BID) | ORAL | 0 refills | Status: AC

## 2023-06-12 NOTE — ED Triage Notes (Signed)
 Pt comes with c/o belly pain. Pt states this started on Friday. Pt states lower belly pain. Pt states nausea. Pt denies any urinary symptoms.

## 2023-06-12 NOTE — ED Notes (Signed)
 Pt to CT

## 2023-06-12 NOTE — ED Notes (Signed)
 Pt given ginger ale for PO challenge. Pt took a couple sips and stated that she was feeling better.

## 2023-06-12 NOTE — ED Provider Notes (Signed)
 [facility] Provider Note    Event Date/Time   First MD Initiated Contact with Patient 06/12/23 1609     (approximate)   History   Chief Complaint: Abdominal Pain   HPI  Cassandra Conley is a 53 y.o. female with a history of diabetes who comes ED complaining of generalized abdominal pain for the past 2 days, gradual onset and worsening.  Associated with nausea.  No dysuria frequency urgency.  No diarrhea.        Past Medical History:  Diagnosis Date   Diabetes mellitus without complication [facility])     Current Outpatient Rx   Order #: [id number] Class: Normal   Order #: [id number] Class: Normal   Order #: [id number] Class: Normal   Order #: [id number] Class: Normal   Order #: [id number] Class: Normal   Order #: [id number] Class: Normal   Order #: [id number] Class: Normal   Order #: [id number] Class: Normal   Order #: [id number] Class: Normal   Order #: [id number] Class: Normal   Order #: [id number] Class: Normal   Order #: [id number] Class: Normal   Order #: [id number] Class: Normal   Order #: [id number] Class: Normal   Order #: [id number] Class: Normal    History reviewed. No pertinent surgical history.  Physical Exam   Triage Vital Signs: ED Triage Vitals  Encounter Vitals Group     BP 06/12/23 1601 (!) 139/95     Systolic BP Percentile --      Diastolic BP Percentile --      Pulse Rate 06/12/23 1601 89     Resp 06/12/23 1601 18     Temp 06/12/23 1601 97.8 F (36.6 C)     Temp src --      SpO2 06/12/23 1601 97 %     Weight 06/12/23 1558 185 lb (83.9 kg)     Height 06/12/23 1558 5\' 6"  (1.676 m)     Head Circumference --      Peak Flow --      Pain Score 06/12/23 1558 10     Pain Loc --      Pain Education --      Exclude from Growth Chart --     Most recent vital signs: Vitals:   06/12/23 2000 06/12/23 2030  BP: 120/70 (!) 113/55  Pulse: 74 76  Resp:  18  Temp:    SpO2: 97% 96%    General: Awake, no distress.  CV:  Good peripheral  perfusion.  Regular rate rhythm Resp:  Normal effort.  Clear to auscultation bilaterally Abd:  No distention.  Soft, mild left upper quadrant tenderness Other:  Somewhat dry oral mucosa   ED Results / Procedures / Treatments   Labs (all labs ordered are listed, but only abnormal results are displayed) Labs Reviewed  COMPREHENSIVE METABOLIC PANEL WITH GFR - Abnormal; Notable for the following components:      Result Value   Sodium 130 (*)    Glucose, Bld 315 (*)    Calcium 8.7 (*)    Albumin 3.4 (*)    All other components within normal limits  CBC - Abnormal; Notable for the following components:   MCV 79.9 (*)    All other components within normal limits  URINALYSIS, ROUTINE W REFLEX MICROSCOPIC - Abnormal; Notable for the following components:   Color, Urine YELLOW (*)    APPearance CLEAR (*)    Glucose, UA >=500 (*)    All other components within normal limits  LIPASE, BLOOD     EKG    RADIOLOGY  PROCEDURES:  Procedures   MEDICATIONS ORDERED IN ED: Medications  sodium chloride 0.9 % bolus 1,000 mL (0 mLs Intravenous Stopped 06/12/23 1957)  ondansetron  (ZOFRAN ) injection 4 mg (4 mg Intravenous Given 06/12/23 1643)  ketorolac  (TORADOL ) 15 MG/ML injection 15 mg (15 mg Intravenous Given 06/12/23 1643)  iohexol (OMNIPAQUE) 300 MG/ML solution 100 mL (100 mLs Intravenous Contrast Given 06/12/23 1811)     IMPRESSION / MDM / ASSESSMENT AND PLAN / ED COURSE  I reviewed the triage vital signs and the nursing notes.  DDx: Dehydration, AKI, electrolyte derangement, pancreatitis, viral gastroenteritis  Patient's presentation is most consistent with acute presentation with potential threat to life or bodily function.  Patient presents with abdominal pain, nausea, poor oral intake.  Vital signs are normal, exam is overall reassuring, nonfocal.  However pain level is severe and presentation. Will give IV fluids, Toradol  and Zofran  for symptom relief, check labs.   Clinical  Course as of 06/12/23 2053  [name] Jun 12, 2023  1750 Labs unremarkable but with severe pain will need to obtain CT [PS]    Clinical Course User Index [PS] [name], MD     ----------------------------------------- 8:52 PM on 06/12/2023 ----------------------------------------- CT unremarkable.  Patient feeling better.  Presentation consistent with a viral illness.  Tolerating oral intake.  Considered admission but with reassuring workup and vitals and improvement in symptoms, I think she stable for discharge home.  FINAL CLINICAL IMPRESSION(S) / ED DIAGNOSES   Final diagnoses:  Acute gastritis without hemorrhage, unspecified gastritis type  Generalized abdominal pain     Rx / DC Orders   ED Discharge Orders          Ordered    famotidine (PEPCID) 20 MG tablet  2 times daily        06/12/23 2049    dicyclomine  (BENTYL ) 20 MG tablet  Every 6 hours PRN        06/12/23 2049    ondansetron  (ZOFRAN -ODT) 4 MG disintegrating tablet  Every 8 hours PRN        06/12/23 2049             Note:  This document was prepared using Dragon voice recognition software and may include unintentional dictation errors.   [name], MD 06/12/23 2053

## 2023-06-12 NOTE — ED Notes (Signed)
 Pt back from CT

## 2023-12-21 ENCOUNTER — Other Ambulatory Visit: Payer: Self-pay | Admitting: Family Medicine

## 2023-12-21 DIAGNOSIS — Z1231 Encounter for screening mammogram for malignant neoplasm of breast: Secondary | ICD-10-CM
